# Patient Record
Sex: Female | Born: 2000 | Race: Black or African American | Hispanic: No | Marital: Single | Smoking: Former smoker
Health system: Southern US, Community
[De-identification: ages and names within clinical notes are randomized; demographics above are authoritative.]

## PROBLEM LIST (undated history)

## (undated) ENCOUNTER — Inpatient Hospital Stay (HOSPITAL_COMMUNITY): Payer: Self-pay

## (undated) DIAGNOSIS — Z789 Other specified health status: Secondary | ICD-10-CM

## (undated) DIAGNOSIS — N739 Female pelvic inflammatory disease, unspecified: Secondary | ICD-10-CM

## (undated) HISTORY — PX: NO PAST SURGERIES: SHX2092

---
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
## 2019-08-09 ENCOUNTER — Emergency Department (HOSPITAL_COMMUNITY): Payer: Medicaid Other

## 2019-08-09 ENCOUNTER — Encounter (HOSPITAL_COMMUNITY): Payer: Self-pay

## 2019-08-09 ENCOUNTER — Emergency Department (HOSPITAL_COMMUNITY)
Admission: EM | Admit: 2019-08-09 | Discharge: 2019-08-09 | Disposition: A | Discharge status: Home / Self Care | Payer: Medicaid Other | Attending: Emergency Medicine | Admitting: Emergency Medicine

## 2019-08-09 ENCOUNTER — Other Ambulatory Visit: Payer: Self-pay

## 2019-08-09 DIAGNOSIS — N939 Abnormal uterine and vaginal bleeding, unspecified: Secondary | ICD-10-CM | POA: Insufficient documentation

## 2019-08-09 DIAGNOSIS — Z79899 Other long term (current) drug therapy: Secondary | ICD-10-CM | POA: Diagnosis not present

## 2019-08-09 DIAGNOSIS — N739 Female pelvic inflammatory disease, unspecified: Secondary | ICD-10-CM | POA: Insufficient documentation

## 2019-08-09 DIAGNOSIS — N730 Acute parametritis and pelvic cellulitis: Secondary | ICD-10-CM

## 2019-08-09 DIAGNOSIS — Z87891 Personal history of nicotine dependence: Secondary | ICD-10-CM | POA: Diagnosis not present

## 2019-08-09 HISTORY — DX: Female pelvic inflammatory disease, unspecified: N73.9

## 2019-08-09 LAB — POC URINE PREG, ED: Preg Test, Ur: NEGATIVE

## 2019-08-09 MED ORDER — METRONIDAZOLE 500 MG PO TABS
2000.0000 mg | ORAL_TABLET | Freq: Once | ORAL | Status: AC
  Administered 2019-08-09: 2000 mg via ORAL
  Filled 2019-08-09: qty 4

## 2019-08-09 NOTE — Discharge Instructions (Addendum)
You will need to take the doxycycline that you are prescribed.  I would recommend following up with a GYN doctor as well.  Your ultrasound did not show any abnormality at this time.

## 2019-08-09 NOTE — ED Triage Notes (Signed)
Pt presents to ED with complaints of vaginal bleeding. Pt states she has had intermittent vaginal bleeding with depo injection and states she was recently treated for trich and pelvic inflammatory disease

## 2019-08-17 NOTE — ED Provider Notes (Signed)
[facility] EMERGENCY DEPARTMENT Provider Note   CSN: [id number] Arrival date & time: 08/09/19  1030     History Chief Complaint  Patient presents with  . Vaginal Bleeding    Stacy Richardson is a 19 y.o. female.  HPI Patient presents to the emergency department with vaginal bleeding that has been recently treated for trichomonas and PID.  The patient states she did not take the antibiotics for the PID.  She states that she was unsure of exactly what was being treated.  The patient states that nothing seems to make her condition better or worse.  Patient states that the bleeding is, dark brownish colored fluid.  The patient denies chest pain, shortness of breath, headache,blurred vision, neck pain, fever, cough, weakness, numbness, dizziness, anorexia, edema, , nausea, vomiting, diarrhea, rash, back pain, dysuria, hematemesis, bloody stool, near syncope, or syncope.  Patient is also having lower pelvic pain.    Past Medical History:  Diagnosis Date  . Medical history non-contributory   . Pelvic inflammatory disease     Patient Active Problem List   Diagnosis Date Noted  . Status post vacuum-assisted vaginal delivery 01/28/2018  . PROM (premature rupture of membranes) 01/27/2018    Past Surgical History:  Procedure Laterality Date  . NO PAST SURGERIES       OB History    Gravida  1   Para  1   Term  1   Preterm      AB      Living  1     SAB      TAB      Ectopic      Multiple  0   Live Births  1           No family history on file.  Social History   Tobacco Use  . Smoking status: Former Smoker    Quit date: 06/25/2017    Years since quitting: 2.1  . Smokeless tobacco: Never Used  Substance Use Topics  . Alcohol use: No  . Drug use: No    Types: Marijuana    Home Medications Prior to Admission medications   Medication Sig Start Date End Date Taking? Authorizing Provider  ibuprofen (ADVIL,MOTRIN) 600 MG tablet Take 1 tablet (600 mg total)  by mouth every 6 (six) hours. 01/29/18   Arvilla Market, DO  Prenatal Multivit-Min-Fe-FA (PRENATAL VITAMINS) 0.8 MG tablet Take 1 tablet by mouth daily. 06/26/17   Vicki Mallet, MD  senna-docusate (SENOKOT-S) 8.6-50 MG tablet Take 2 tablets by mouth daily. 01/30/18   Arvilla Market, DO    Allergies    Patient has no known allergies.  Review of Systems   Review of Systems All other systems negative except as documented in the HPI. All pertinent positives and negatives as reviewed in the HPI. Physical Exam Updated Vital Signs BP 109/64   Pulse 85   Temp 98.5 F (36.9 C) (Oral)   Resp 20   Ht 5\' 4"  (1.626 m)   Wt 53.5 kg   SpO2 100%   BMI 20.25 kg/m   Physical Exam Vitals and nursing note reviewed.  Constitutional:      General: She is not in acute distress.    Appearance: She is well-developed.  HENT:     Head: Normocephalic and atraumatic.  Eyes:     Pupils: Pupils are equal, round, and reactive to light.  Cardiovascular:     Rate and Rhythm: Normal rate and regular rhythm.  Heart sounds: Normal heart sounds. No murmur. No friction rub. No gallop.   Pulmonary:     Effort: Pulmonary effort is normal. No respiratory distress.     Breath sounds: Normal breath sounds. No wheezing.  Abdominal:     General: Bowel sounds are normal. There is no distension.     Palpations: Abdomen is soft.     Tenderness: There is no abdominal tenderness.  Genitourinary:    Comments: Patient declined pelvic exam at this time due to the fact she had a recent 1. Musculoskeletal:     Cervical back: Normal range of motion and neck supple.  Skin:    General: Skin is warm and dry.     Capillary Refill: Capillary refill takes less than 2 seconds.     Findings: No erythema or rash.  Neurological:     Mental Status: She is alert and oriented to person, place, and time.     Motor: No abnormal muscle tone.     Coordination: Coordination normal.  Psychiatric:         Behavior: Behavior normal.     ED Results / Procedures / Treatments   Labs (all labs ordered are listed, but only abnormal results are displayed) Labs Reviewed  POC URINE PREG, ED    EKG None  Radiology No results found.  Procedures Procedures (including critical care time)  Medications Ordered in ED Medications  metroNIDAZOLE (FLAGYL) tablet 2,000 mg (2,000 mg Oral Given 08/09/19 1426)    ED Course  I have reviewed the triage vital signs and the nursing notes.  Pertinent labs & imaging results that were available during my care of the patient were reviewed by me and considered in my medical decision making (see chart for details).    MDM Rules/Calculators/A&P                      Patient will be asked to take the antibiotics that Were prescribed.  She is given treatment here in the emergency department as well.  The ultrasound does not show any signs of TOA.  Patient is referred back to her GYN doctor in the [facility] clinic as needed. Final Clinical Impression(s) / ED Diagnoses Final diagnoses:  Abnormal vaginal bleeding  PID (acute pelvic inflammatory disease)    Rx / DC Orders ED Discharge Orders    None       Dalia Heading, PA-C 08/17/19 2312    Daleen Bo, MD 08/18/19 [phone]

## 2019-08-27 ENCOUNTER — Emergency Department (HOSPITAL_COMMUNITY)
Admission: EM | Admit: 2019-08-27 | Discharge: 2019-08-27 | Disposition: A | Discharge status: Home / Self Care | Payer: Medicaid Other | Attending: Emergency Medicine | Admitting: Emergency Medicine

## 2019-08-27 ENCOUNTER — Other Ambulatory Visit: Payer: Self-pay

## 2019-08-27 ENCOUNTER — Encounter (HOSPITAL_COMMUNITY): Payer: Self-pay | Admitting: *Deleted

## 2019-08-27 DIAGNOSIS — N939 Abnormal uterine and vaginal bleeding, unspecified: Secondary | ICD-10-CM | POA: Diagnosis present

## 2019-08-27 DIAGNOSIS — Z793 Long term (current) use of hormonal contraceptives: Secondary | ICD-10-CM | POA: Diagnosis not present

## 2019-08-27 DIAGNOSIS — Z87891 Personal history of nicotine dependence: Secondary | ICD-10-CM | POA: Insufficient documentation

## 2019-08-27 LAB — WET PREP, GENITAL
Clue Cells Wet Prep HPF POC: NONE SEEN
Sperm: NONE SEEN
Trich, Wet Prep: NONE SEEN
WBC, Wet Prep HPF POC: NONE SEEN
Yeast Wet Prep HPF POC: NONE SEEN

## 2019-08-27 LAB — POC URINE PREG, ED: Preg Test, Ur: NEGATIVE

## 2019-08-27 NOTE — ED Provider Notes (Signed)
[facility] EMERGENCY DEPARTMENT Provider Note   CSN: [id number] Arrival date & time: 08/27/19  1615     History Chief Complaint  Patient presents with  . Vaginal Bleeding    Stacy Richardson is a 19 y.o. female.  HPI   This patient is a 19 year old female who has been on the Depo shot for the last 18 months, she has not had any significant bleeding other than occasional spotting when she has an STD.  She reports that in the last couple of weeks she has had some abnormal bleeding for which she has went to the urgent care, was treated for pelvic inflammatory disease with "a shot and a pill that starts with D and a pill that starts with M".  She reports that she did not take the pills because she was told that the shot would cover all kinds of bacteria.  When the bleeding became more heavy and she started to have some clear discharge she decided to take the other medications though she did not take them regularly.  She is now having heavier bleeding over the last 24 hours with the occasional lower abdominal cramps.  She is due for her Depo shot in the next 48 hours but because of the bleeding today she had to leave work early to come to the hospital.  She denies nausea vomiting fevers or chills or back pain.  She is still sexually active, she denies any injuries or pain with sex.  Past Medical History:  Diagnosis Date  . Medical history non-contributory   . Pelvic inflammatory disease     Patient Active Problem List   Diagnosis Date Noted  . Status post vacuum-assisted vaginal delivery 01/28/2018  . PROM (premature rupture of membranes) 01/27/2018    Past Surgical History:  Procedure Laterality Date  . NO PAST SURGERIES       OB History    Gravida  1   Para  1   Term  1   Preterm      AB      Living  1     SAB      TAB      Ectopic      Multiple  0   Live Births  1           History reviewed. No pertinent family history.  Social History   Tobacco Use    . Smoking status: Former Smoker    Quit date: 06/25/2017    Years since quitting: 2.1  . Smokeless tobacco: Never Used  Substance Use Topics  . Alcohol use: No  . Drug use: Not Currently    Types: Marijuana    Home Medications Prior to Admission medications   Medication Sig Start Date End Date Taking? Authorizing Provider  medroxyPROGESTERone (DEPO-PROVERA) 150 MG/ML injection Inject 150 mg into the muscle every 3 (three) months.   Yes [provider]    Allergies    Patient has no known allergies.  Review of Systems   Review of Systems  All other systems reviewed and are negative.   Physical Exam Updated Vital Signs BP 119/84 (BP Location: Right Arm)   Pulse 62   Temp 97.6 F (36.4 C) (Oral)   Resp 20   Ht 1.626 m (5\' 4" )   Wt 53.1 kg   SpO2 100%   BMI 20.08 kg/m   Physical Exam Vitals and nursing note reviewed.  Constitutional:      General: She is not in acute  distress.    Appearance: She is well-developed.  HENT:     Head: Normocephalic and atraumatic.     Mouth/Throat:     Pharynx: No oropharyngeal exudate.  Eyes:     General: No scleral icterus.       Right eye: No discharge.        Left eye: No discharge.     Conjunctiva/sclera: Conjunctivae normal.     Pupils: Pupils are equal, round, and reactive to light.  Neck:     Thyroid: No thyromegaly.     Vascular: No JVD.  Cardiovascular:     Rate and Rhythm: Normal rate and regular rhythm.     Heart sounds: Normal heart sounds. No murmur. No friction rub. No gallop.   Pulmonary:     Effort: Pulmonary effort is normal. No respiratory distress.     Breath sounds: Normal breath sounds. No wheezing or rales.  Abdominal:     General: Bowel sounds are normal. There is no distension.     Palpations: Abdomen is soft. There is no mass.     Tenderness: There is no abdominal tenderness.  Genitourinary:    Comments: Chaperone present for exam: Normal-appearing external genitalia, normal-appearing  cervix, vaginal vault full of bright and dark red blood.  No foul odor, no foreign body, no tenderness Musculoskeletal:        General: No tenderness. Normal range of motion.     Cervical back: Normal range of motion and neck supple.  Lymphadenopathy:     Cervical: No cervical adenopathy.  Skin:    General: Skin is warm and dry.     Findings: No erythema or rash.  Neurological:     Mental Status: She is alert.     Coordination: Coordination normal.  Psychiatric:        Behavior: Behavior normal.     ED Results / Procedures / Treatments   Labs (all labs ordered are listed, but only abnormal results are displayed) Labs Reviewed  WET PREP, GENITAL  POC URINE PREG, ED  GC/CHLAMYDIA PROBE AMP ([place]) NOT AT [facility]    EKG None  Radiology No results found.  Procedures Procedures (including critical care time)  Medications Ordered in ED Medications - No data to display  ED Course  I have reviewed the triage vital signs and the nursing notes.  Pertinent labs & imaging results that were available during my care of the patient were reviewed by me and considered in my medical decision making (see chart for details).    MDM Rules/Calculators/A&P                      I discussed the care with the on-call gynecologist Dr. Despina Hidden who recommends that the patient can follow-up for her Depo shot on Wednesday, states that the bleeding should taper off after that.  Recommends against any oral medications at this time, this will be communicated to the patient.  She appears hemodynamically very stable, preg negative.    Vitals:   08/27/19 1621 08/27/19 1622  BP: 119/84   Pulse: 62   Resp: 20   Temp: 97.6 F (36.4 C)   TempSrc: Oral   SpO2: 100%   Weight:  53.1 kg  Height:  1.626 m (5\' 4" )     Final Clinical Impression(s) / ED Diagnoses Final diagnoses:  Vaginal bleeding    Rx / DC Orders ED Discharge Orders    None       ,  MD 08/27/19 1918

## 2019-08-27 NOTE — ED Triage Notes (Signed)
Pt with vaginal bleeding since Saturday, passed a large clot today and other clots.  Pt states she took some antibiotics "that were left over " from last time pt was here and dx with trichomonas.

## 2019-08-27 NOTE — Discharge Instructions (Signed)
Your testing has returned and so far it shows no signs of sexually transmitted diseases.  Your pregnancy test is negative  Your bleeding is likely because of the need for another Depo shot.  After you get the Depo shot your bleeding should slow down over the next week and then stop.  I talked to the gynecologist on-call and gave you their phone number above.  Please call the office for the next available appointment to establish care with a gynecologist.  As far as treatment of the bleeding, they recommended against using any medications at this time as the bleeding will stop with the Depo shot and the other medications can have side effects.

## 2019-08-28 LAB — GC/CHLAMYDIA PROBE AMP (~~LOC~~) NOT AT ARMC
Chlamydia: NEGATIVE
Neisseria Gonorrhea: NEGATIVE

## 2019-09-10 ENCOUNTER — Encounter (INDEPENDENT_AMBULATORY_CARE_PROVIDER_SITE_OTHER): Payer: Self-pay | Admitting: Nurse Practitioner

## 2019-09-10 ENCOUNTER — Other Ambulatory Visit: Payer: Self-pay

## 2019-09-10 ENCOUNTER — Ambulatory Visit (INDEPENDENT_AMBULATORY_CARE_PROVIDER_SITE_OTHER): Payer: Medicaid Other | Admitting: Nurse Practitioner

## 2019-09-10 VITALS — BP 90/50 | Temp 98.5°F | Ht 64.0 in | Wt 115.0 lb

## 2019-09-10 DIAGNOSIS — Z113 Encounter for screening for infections with a predominantly sexual mode of transmission: Secondary | ICD-10-CM | POA: Diagnosis not present

## 2019-09-10 DIAGNOSIS — Z131 Encounter for screening for diabetes mellitus: Secondary | ICD-10-CM | POA: Diagnosis not present

## 2019-09-10 DIAGNOSIS — Z139 Encounter for screening, unspecified: Secondary | ICD-10-CM

## 2019-09-10 DIAGNOSIS — N939 Abnormal uterine and vaginal bleeding, unspecified: Secondary | ICD-10-CM

## 2019-09-10 DIAGNOSIS — N730 Acute parametritis and pelvic cellulitis: Secondary | ICD-10-CM

## 2019-09-10 DIAGNOSIS — B3731 Acute candidiasis of vulva and vagina: Secondary | ICD-10-CM

## 2019-09-10 DIAGNOSIS — R6889 Other general symptoms and signs: Secondary | ICD-10-CM

## 2019-09-10 DIAGNOSIS — Z13220 Encounter for screening for lipoid disorders: Secondary | ICD-10-CM

## 2019-09-10 DIAGNOSIS — B373 Candidiasis of vulva and vagina: Secondary | ICD-10-CM

## 2019-09-10 MED ORDER — DOXYCYCLINE HYCLATE 100 MG PO TABS: 100.0000 mg | ORAL_TABLET | Freq: Two times a day (BID) | ORAL | 0 refills | Status: DC

## 2019-09-10 MED ORDER — FLUCONAZOLE 150 MG PO TABS: 150.0000 mg | ORAL_TABLET | Freq: Once | ORAL | 0 refills | Status: AC

## 2019-09-10 NOTE — Patient Instructions (Signed)
Thank you for choosing [facility] as your medical provider! If you have any questions or concerns regarding your health care, please do not hesitate to call our office.  I will notify you of all of your blood work results as they are released via your MyChart.  You should hear from the OB/GYN's office within 2 weeks to schedule your appointment with them.  If you do not hear from them, please call this office to let us know so we can address this for you.  Please complete your antibiotic as prescribed in its entirety.  Please follow-up as scheduled in 1 month. We look forward to seeing you again soon!   At [facility] we value your feedback. You may receive a survey about your visit today. Please share your experience as we strive to create trusting relationships with our patients to provide genuine, compassionate, quality care.  We appreciate your understanding and patience as we review any laboratory studies, imaging, and other diagnostic tests that are ordered as we care for you. We do our best to address any and all results in a timely manner. If you do not hear about test results within 1 week, please do not hesitate to contact us. If we referred you to a specialist during your visit or ordered imaging testing, contact the office if you have not been contacted to be scheduled within 1 weeks.  We also encourage the use of MyChart, which contains your medical information for your review as well. If you are not enrolled in this feature, an access code is on this after visit summary for your convenience. Thank you for allowing [place] to be involved in your care.

## 2019-09-10 NOTE — Progress Notes (Signed)
Subjective:  Patient ID: Stacy Richardson, female    DOB: [birth year]  Age: 19 y.o. MRN: [id number]  CC:  Chief Complaint  Patient presents with  . Other    Establish care, vaginal bleeding      HPI  This patient comes in today for the above.  She is new to this area and recently moved from [place].  She was getting her health care at the health department in [place].  She would like to establish care as a new patient here.  She has not had the flu shot this year, and would like to hold off on having this administered at this time.  She believes she got the tetanus shot during her last pregnancy which was approximately 2 years ago.  She does not have any history of chronic medical condition that would require Pneumovax vaccination.  Her main complaint today is that she has been having persistent vaginal bleeding.  She tells me in February she started having persistent vaginal bleeding.  At that time she ended up being evaluated for this in the emergency department, and she was found to be positive for trichomonas infection.  It was thought that she also may have pelvic inflammatory disease.  She was started on a course of metronidazole, and completed this course.  She was also told to take doxycycline, but she did not complete this medication course.  Her vaginal bleeding has been intermittent, but seems to be elicited by sexual intercourse.  Last time she was evaluated was in the emergency department approximately 2 weeks ago for this.  Pregnancy was negative at that time.  She was also tested for gonorrhea and chlamydia both of which were negative at that time.  Wet prep was completed and was negative for trichomonas, yeast, and BV.  She tells me she does not experience pain generally during sexual intercourse, and does not have abdominal or pelvic pain right now.  She tells me that she has been bleeding vaginally for the past 4 days, but the bleeding has started to lighten up.  She  denies any fatigue, shortness of breath, dizziness, or near syncopal episodes.  She does use Depo-Provera IM shots for contraception.  She has been on this medication for multiple years prior to her pregnancy, and has been on it again for the last 18 months.  She has tolerated this well in the past without excessive vaginal bleeding.  Last administration was approximately 1 week ago.   Past Medical History:  Diagnosis Date  . Medical history non-contributory   . Pelvic inflammatory disease       No family history on file.  Social History   Social History Narrative  . Not on file   Social History   Tobacco Use  . Smoking status: Former Smoker    Quit date: 06/25/2017    Years since quitting: 2.2  . Smokeless tobacco: Never Used  Substance Use Topics  . Alcohol use: No     No outpatient medications have been marked as taking for the 09/10/19 encounter (Office Visit) with Ailene Ards, NP.    ROS:  Review of Systems  Constitutional: Negative.   Eyes: Negative.   Respiratory: Negative.   Cardiovascular: Negative.   Gastrointestinal: Negative.   Genitourinary:       (+) persistent vaginal bleeding  Musculoskeletal: Negative.   Neurological: Negative.   Psychiatric/Behavioral: Negative.      Objective:   Today's Vitals: BP (!) 90/50 (BP Location: Left  Arm, Patient Position: Sitting, Cuff Size: Normal)   Temp 98.5 F (36.9 C) (Oral)   Ht _0  (1.626 m)   Wt 115 lb (52.2 kg)   BMI 19.74 kg/m  Vitals with BMI 09/10/2019 08/27/2019 08/27/2019  Height _1  - _2   Weight 115 lbs - 117 lbs  BMI 58.85 - 02.77  Systolic 90 412 -  Diastolic 50 76 -  Pulse - 70 -     Physical Exam Vitals reviewed.  Constitutional:      General: She is not in acute distress.    Appearance: Normal appearance.  HENT:     Head: Normocephalic and atraumatic.  Neck:     Vascular: No carotid bruit.  Cardiovascular:     Rate and Rhythm: Normal rate and regular rhythm.     Pulses:  Normal pulses.     Heart sounds: Normal heart sounds.  Pulmonary:     Effort: Pulmonary effort is normal.     Breath sounds: Normal breath sounds.  Skin:    General: Skin is warm and dry.  Neurological:     General: No focal deficit present.     Mental Status: She is alert and oriented to person, place, and time.  Psychiatric:        Mood and Affect: Mood normal.        Behavior: Behavior normal.        Judgment: Judgment normal.          Assessment and Plan   1. PID (acute pelvic inflammatory disease)   2. Screening for condition   3. Routine screening for STI (sexually transmitted infection)   4. Screening for diabetes mellitus   5. Screening for hyperlipidemia   6. Cold intolerance   7. Vaginal bleeding   8. Vaginal yeast infection      Plan: 1.,  7.,  8.  For now I will restart her course of doxycycline for probable PID.  She did mention that she is prone to yeast infections, thus I will give her prophylactic Diflucan.  I am also can refer her to OB/GYN for further evaluation and management of her persistent vaginal bleeding, and discussion of possibly needing to change to a different contraception.  I will also check blood work today including CBC to monitor for anemia.  I do see that her blood pressure was fairly low on exam today, however she is asymptomatic at this time.  2.,  3.,  4.,  5.,  6.  I will collect blood work for further evaluation.  She declined to have flu shot administered today, however I have encouraged her to let us know if she changes her mind.  She tells me she understands.   Tests ordered Orders Placed This Encounter  Procedures  . CBC  . CMP with eGFR([facility])  . Lipid Panel  . Hemoglobin A1c  . TSH  . T3, Free  . T4, Free  . HIV antibody (with reflex)  . RPR  . Ferritin  . Iron and TIBC  . Vitamin D, 25-hydroxy  . Ambulatory referral to Obstetrics / Gynecology      Meds ordered this encounter  Medications  . doxycycline  (VIBRA-TABS) 100 MG tablet    Sig: Take 1 tablet (100 mg total) by mouth 2 (two) times daily.    Dispense:  28 tablet    Refill:  0    Order Specific Question:   Supervising Provider    Answer:   Doree Albee [  [id number]]  . fluconazole (DIFLUCAN) 150 MG tablet    Sig: Take 1 tablet (150 mg total) by mouth once for 1 dose. You may repeat dose by mouth once if symptoms persist after 3 days    Dispense:  2 tablet    Refill:  0    Order Specific Question:   Supervising Provider    Answer:   Doree Albee [[id number]]    Patient to follow-up in 1 month or sooner as needed.  Ailene Ards, NP

## 2019-09-11 LAB — LIPID PANEL
Cholesterol: 109 mg/dL (ref <170)
HDL: 53 mg/dL (ref >45)
LDL Cholesterol (Calc): 39 mg/dL (calc) (ref <110)
Non-HDL Cholesterol (Calc): 56 mg/dL (calc) (ref <120)
Total CHOL/HDL Ratio: 2.1 (calc) (ref <5.0)
Triglycerides: 91 mg/dL — ABNORMAL HIGH (ref <90)

## 2019-09-11 LAB — COMPLETE METABOLIC PANEL WITH GFR
AG Ratio: 1.9 (calc) (ref 1.0–2.5)
ALT: 18 U/L (ref 5–32)
AST: 13 U/L (ref 12–32)
Albumin: 4.2 g/dL (ref 3.6–5.1)
Alkaline phosphatase (APISO): 50 U/L (ref 36–128)
BUN: 9 mg/dL (ref 7–20)
CO2: 26 mmol/L (ref 20–32)
Calcium: 9.3 mg/dL (ref 8.9–10.4)
Chloride: 109 mmol/L (ref 98–110)
Creat: 0.68 mg/dL (ref 0.50–1.00)
GFR, Est African American: 147 mL/min/{1.73_m2} (ref >=60)
GFR, Est Non African American: 127 mL/min/{1.73_m2} (ref >=60)
Globulin: 2.2 g/dL (calc) (ref 2.0–3.8)
Glucose, Bld: 82 mg/dL (ref 65–99)
Potassium: 4.2 mmol/L (ref 3.8–5.1)
Sodium: 140 mmol/L (ref 135–146)
Total Bilirubin: 0.3 mg/dL (ref 0.2–1.1)
Total Protein: 6.4 g/dL (ref 6.3–8.2)

## 2019-09-11 LAB — IRON, TOTAL/TOTAL IRON BINDING CAP
%SAT: 28 % (calc) (ref 15–45)
Iron: 90 ug/dL (ref 27–164)
TIBC: 327 mcg/dL (calc) (ref 271–448)

## 2019-09-11 LAB — HIV ANTIBODY (ROUTINE TESTING W REFLEX): HIV 1&2 Ab, 4th Generation: NONREACTIVE

## 2019-09-11 LAB — CBC
HCT: 38.7 % (ref 35.0–45.0)
Hemoglobin: 12.4 g/dL (ref 11.7–15.5)
MCH: 28.1 pg (ref 27.0–33.0)
MCHC: 32 g/dL (ref 32.0–36.0)
MCV: 87.8 fL (ref 80.0–100.0)
MPV: 9.4 fL (ref 7.5–12.5)
Platelets: 306 10*3/uL (ref 140–400)
RBC: 4.41 10*6/uL (ref 3.80–5.10)
RDW: 11.5 % (ref 11.0–15.0)
WBC: 6.7 10*3/uL (ref 3.8–10.8)

## 2019-09-11 LAB — FERRITIN: Ferritin: 8 ng/mL — ABNORMAL LOW (ref 16–154)

## 2019-09-11 LAB — VITAMIN D 25 HYDROXY (VIT D DEFICIENCY, FRACTURES): Vit D, 25-Hydroxy: 19 ng/mL — ABNORMAL LOW (ref 30–100)

## 2019-09-11 LAB — T4, FREE: Free T4: 1.1 ng/dL (ref 0.8–1.4)

## 2019-09-11 LAB — HEMOGLOBIN A1C
Hgb A1c MFr Bld: 5.1 % of total Hgb (ref <5.7)
Mean Plasma Glucose: 100 (calc)
eAG (mmol/L): 5.5 (calc)

## 2019-09-11 LAB — T3, FREE: T3, Free: 3 pg/mL (ref 3.0–4.7)

## 2019-09-11 LAB — TSH: TSH: 1.13 mIU/L

## 2019-09-11 LAB — RPR: RPR Ser Ql: NONREACTIVE

## 2019-09-17 ENCOUNTER — Telehealth: Payer: Self-pay | Admitting: *Deleted

## 2019-09-17 NOTE — Telephone Encounter (Signed)
I called pt and she said she didn't have any questions now. Has appt on 09/21/19. Plans on keeping that appt. JSY

## 2019-09-17 NOTE — Telephone Encounter (Signed)
Has an appt Friday but wants a nurse to call. Pt didn't leave any more info. Please call. Thanks!!

## 2019-09-21 ENCOUNTER — Other Ambulatory Visit: Payer: Self-pay

## 2019-09-21 ENCOUNTER — Ambulatory Visit (INDEPENDENT_AMBULATORY_CARE_PROVIDER_SITE_OTHER): Payer: Medicaid Other | Admitting: Adult Health

## 2019-09-21 ENCOUNTER — Encounter: Payer: Self-pay | Admitting: Adult Health

## 2019-09-21 VITALS — BP 110/59 | HR 114 | Ht 64.0 in | Wt 111.0 lb

## 2019-09-21 DIAGNOSIS — N939 Abnormal uterine and vaginal bleeding, unspecified: Secondary | ICD-10-CM | POA: Diagnosis not present

## 2019-09-21 DIAGNOSIS — Z8742 Personal history of other diseases of the female genital tract: Secondary | ICD-10-CM | POA: Insufficient documentation

## 2019-09-21 MED ORDER — MEGESTROL ACETATE 40 MG PO TABS: ORAL_TABLET | ORAL | 1 refills | Status: DC

## 2019-09-21 NOTE — Progress Notes (Signed)
Patient ID: Stacy Richardson, female   DOB: 09-06-2000, 19 y.o.   MRN: [id number] History of Present Illness: Stacy Richardson is a 19 year old black female,single, G1P1 in complaining of vaginal bleeding on depo.Started in February and was treated for PID and trich in February and bleeding was heavier and had clots and seen in ER 08/27/19, and then got depo at health dept in [place] on 3/3 and bleeding stopped, had sex 3/6 and bleeding started back. On doxycyline from PCP  PCP is Jiles Prows NP     Current Medications, Allergies, Past Medical History, Past Surgical History, Family History and Social History were reviewed in [name] record.     Review of Systems: Vaginal bleeding since February Treated for PID and trich in February Seen in ER 08/27/19 for vaginal bleeding with clots Got depo 08/29/19 and bleeding stopped and had sex 3/6 and bleeding started back, was seen at PCP and given doxycycline is still taking. Denies any headaches or dizziness     Physical Exam:BP (!) 110/59 (BP Location: Left Arm, Patient Position: Sitting, Cuff Size: Normal)   Pulse (!) 114   Ht 5\' 4"  (1.626 m)   Wt 111 lb (50.3 kg)   Breastfeeding No   BMI 19.05 kg/m  General:  Well developed, well nourished, no acute distress Skin:  Warm and dry Neck:  Midline trachea, normal thyroid, good ROM, no lymphadenopathy Lungs; Clear to auscultation bilaterally Cardiovascular: Regular rate and rhythm Pelvic:  External genitalia is normal in appearance, no lesions.  The vagina is normal in appearance,scant pink discharge with out odor. Urethra has no lesions or masses. The cervix is bulbous.No CMT.  Uterus is felt to be normal size, shape, and contour.  No adnexal masses or tenderness noted.Bladder is non tender, no masses felt.nuswab obtained. Extremities/musculoskeletal:  No swelling or varicosities noted, no clubbing or cyanosis Psych:  No mood changes, alert and cooperative,seems happy Alcohol audit  is 0. Examination chaperoned by RN.  Impression and Plan: 1. Abnormal uterine bleeding (AUB) Nuswab sent  Will Rx megace Meds ordered this encounter  Medications  . megestrol (MEGACE) 40 MG tablet    Sig: Take 3 x 5 days then 2 x 5 days then 1 daily    Dispense:  45 tablet    Refill:  1    Order Specific Question:   Supervising Provider    Answer:   Jobe Marker H [[id number]]  Return in 2 weeks for ROS  2. History of PID Finish doxy

## 2019-09-24 LAB — NUSWAB VAGINITIS PLUS (VG+)
Candida albicans, NAA: NEGATIVE
Candida glabrata, NAA: NEGATIVE
Chlamydia trachomatis, NAA: NEGATIVE
Neisseria gonorrhoeae, NAA: NEGATIVE
Trich vag by NAA: NEGATIVE

## 2019-10-05 ENCOUNTER — Other Ambulatory Visit: Payer: Self-pay

## 2019-10-05 ENCOUNTER — Encounter: Payer: Self-pay | Admitting: Adult Health

## 2019-10-05 ENCOUNTER — Ambulatory Visit (INDEPENDENT_AMBULATORY_CARE_PROVIDER_SITE_OTHER): Payer: Medicaid Other | Admitting: Adult Health

## 2019-10-05 VITALS — BP 103/67 | HR 100 | Ht 64.0 in | Wt 111.0 lb

## 2019-10-05 DIAGNOSIS — N921 Excessive and frequent menstruation with irregular cycle: Secondary | ICD-10-CM | POA: Diagnosis not present

## 2019-10-05 DIAGNOSIS — Z8742 Personal history of other diseases of the female genital tract: Secondary | ICD-10-CM | POA: Diagnosis not present

## 2019-10-05 NOTE — Progress Notes (Signed)
  Subjective:     Patient ID: Stacy Richardson, female   DOB: 02-24-2001, 19 y.o.   MRN: [id number]  HPI Stacy Richardson is a 19 year old black female,single, G1P1 back in follow up of AUB with Depo, was given megace but bleeding stopped after visit.  PCP is Jiles Prows, NP  Review of Systems Bleeding stopped after last visit, did not take depo Has no pain, had sex last weekend and no pain  Reviewed past medical,surgical, social and family history. Reviewed medications and allergies.     Objective:   Physical Exam BP 103/67 (BP Location: Left Arm, Patient Position: Sitting, Cuff Size: Normal)   Pulse 100   Ht 5\' 4"  (1.626 m)   Wt 111 lb (50.3 kg)   BMI 19.05 kg/m  Skin warm and dry. Lungs: clear to ausculation bilaterally. Cardiovascular: regular rate and rhythm.    Assessment:     1. Breakthrough bleeding on depo provera Has resolved,did not have to take megace  2. History of PID    Plan:     Follow up prn

## 2019-10-11 ENCOUNTER — Ambulatory Visit (INDEPENDENT_AMBULATORY_CARE_PROVIDER_SITE_OTHER): Payer: Medicaid Other | Admitting: Nurse Practitioner

## 2019-12-10 ENCOUNTER — Ambulatory Visit (INDEPENDENT_AMBULATORY_CARE_PROVIDER_SITE_OTHER): Payer: Medicaid Other | Admitting: Advanced Practice Midwife

## 2019-12-10 ENCOUNTER — Encounter: Payer: Self-pay | Admitting: Advanced Practice Midwife

## 2019-12-10 VITALS — BP 107/62 | HR 89 | Ht 64.0 in | Wt 125.0 lb

## 2019-12-10 DIAGNOSIS — N898 Other specified noninflammatory disorders of vagina: Secondary | ICD-10-CM | POA: Diagnosis not present

## 2019-12-10 MED ORDER — FLUCONAZOLE 150 MG PO TABS: ORAL_TABLET | ORAL | 2 refills | Status: DC

## 2019-12-10 NOTE — Progress Notes (Signed)
[facility] Visit  Patient name: Stacy Richardson MRN [id number]  Date of birth: [birth year]  CC & HPI:  Stacy Richardson is a 19 y.o.  female presenting today for clumpy vaginal discharge about 1.5 weeks ago, now yellow. Itches inside and out.  Hasn't shaved recently, so wondering if that could be making her itch.  Pertinent History Reviewed:  Medical & Surgical Hx:   Past Medical History:  Diagnosis Date  . Medical history non-contributory   . Pelvic inflammatory disease    Past Surgical History:  Procedure Laterality Date  . NO PAST SURGERIES     History reviewed. No pertinent family history.  Current Outpatient Medications:  .  medroxyPROGESTERone (DEPO-PROVERA) 150 MG/ML injection, Inject 150 mg into the muscle every 3 (three) months., Disp: , Rfl:  .  megestrol (MEGACE) 40 MG tablet, Take 3 x 5 days then 2 x 5 days then 1 daily (Patient not taking: Reported on 10/05/2019), Disp: 45 tablet, Rfl: 1 Social History: Reviewed -  reports that she quit smoking about 2 years ago. Her smoking use included cigarettes. She has never used smokeless tobacco.  Review of Systems:   Constitutional: Negative for fever and chills Eyes: Negative for visual disturbances Respiratory: Negative for shortness of breath, dyspnea Cardiovascular: Negative for chest pain or palpitations  Gastrointestinal: Negative for vomiting, diarrhea and constipation; no abdominal pain Genitourinary: Negative for dysuria and urgency, vaginal irritation or itching Musculoskeletal: Negative for back pain, joint pain, myalgias  Neurological: Negative for dizziness and headaches    Objective Findings:    Physical Examination: Vitals:   12/10/19 1524  BP: 107/62  Pulse: 89   General appearance - well appearing, and in no distress Mental status - alert, oriented to person, place, and time Chest:  Normal respiratory effort Heart - normal rate and regular rhythm Abdomen:  Soft, nontender Pelvic: no rash;  sse:  Normal appearing vaginal discharge.  Vagina non erythemas, no odor.  Wet prep small yeast. Musculoskeletal:  Normal range of motion without pain Extremities:  No edema    No results found for this or any previous visit (from the past 24 hour(s)).    Assessment & Plan:  A:   Possible yeast infection vs itching from not shaving  P:   Orders Placed This Encounter  Procedures  . GC/Chlamydia Probe Amp  . Trichomonas vaginalis, RNA  diflucan 150mg  po   No follow-ups on file.  CNM 12/10/2019 3:36 PM

## 2019-12-12 ENCOUNTER — Telehealth: Payer: Self-pay | Admitting: Advanced Practice Midwife

## 2019-12-12 NOTE — Telephone Encounter (Signed)
Patient called stating that she came into the office on 12/10/2019 because she had a yeast infection. Pt states that she would like to know the result of the lab work. Please contact pt

## 2019-12-13 LAB — SPECIMEN STATUS REPORT

## 2019-12-13 LAB — GC/CHLAMYDIA PROBE AMP
Chlamydia trachomatis, NAA: NEGATIVE
Neisseria Gonorrhoeae by PCR: NEGATIVE

## 2019-12-13 LAB — TRICHOMONAS VAGINALIS, PROBE AMP: Trich vag by NAA: NEGATIVE

## 2020-01-22 ENCOUNTER — Encounter (HOSPITAL_COMMUNITY): Payer: Self-pay | Admitting: Emergency Medicine

## 2020-01-22 ENCOUNTER — Other Ambulatory Visit: Payer: Self-pay

## 2020-01-22 ENCOUNTER — Emergency Department (HOSPITAL_COMMUNITY)
Admission: EM | Admit: 2020-01-22 | Discharge: 2020-01-23 | Disposition: A | Discharge status: Home / Self Care | Payer: Medicaid Other | Attending: Emergency Medicine | Admitting: Emergency Medicine

## 2020-01-22 ENCOUNTER — Emergency Department (HOSPITAL_COMMUNITY): Payer: Medicaid Other

## 2020-01-22 DIAGNOSIS — R0789 Other chest pain: Secondary | ICD-10-CM | POA: Diagnosis present

## 2020-01-22 DIAGNOSIS — R1319 Other dysphagia: Secondary | ICD-10-CM

## 2020-01-22 DIAGNOSIS — F17210 Nicotine dependence, cigarettes, uncomplicated: Secondary | ICD-10-CM | POA: Diagnosis not present

## 2020-01-22 DIAGNOSIS — R1310 Dysphagia, unspecified: Secondary | ICD-10-CM | POA: Insufficient documentation

## 2020-01-22 LAB — BASIC METABOLIC PANEL
Anion gap: 6 (ref 5–15)
BUN: 16 mg/dL (ref 6–20)
CO2: 23 mmol/L (ref 22–32)
Calcium: 8.4 mg/dL — ABNORMAL LOW (ref 8.9–10.3)
Chloride: 110 mmol/L (ref 98–111)
Creatinine, Ser: 0.78 mg/dL (ref 0.44–1.00)
GFR calc Af Amer: >60 mL/min (ref >60)
GFR calc non Af Amer: >60 mL/min (ref >60)
Glucose, Bld: 111 mg/dL — ABNORMAL HIGH (ref 70–99)
Potassium: 3.8 mmol/L (ref 3.5–5.1)
Sodium: 139 mmol/L (ref 135–145)

## 2020-01-22 LAB — CBC
HCT: 38.5 % (ref 36.0–46.0)
Hemoglobin: 12.4 g/dL (ref 12.0–15.0)
MCH: 28.4 pg (ref 26.0–34.0)
MCHC: 32.2 g/dL (ref 30.0–36.0)
MCV: 88.1 fL (ref 80.0–100.0)
Platelets: 261 10*3/uL (ref 150–400)
RBC: 4.37 MIL/uL (ref 3.87–5.11)
RDW: 12.5 % (ref 11.5–15.5)
WBC: 9.9 10*3/uL (ref 4.0–10.5)
nRBC: 0 % (ref 0.0–0.2)

## 2020-01-22 LAB — TROPONIN I (HIGH SENSITIVITY): Troponin I (High Sensitivity): <2 ng/L (ref <18)

## 2020-01-22 MED ORDER — SODIUM CHLORIDE 0.9% FLUSH: 3.0000 mL | Freq: Once | INTRAVENOUS | Status: DC

## 2020-01-22 NOTE — ED Triage Notes (Signed)
Pt c/o left sided chest pain intermittently for a while. Pt states tonight it started hurting while vaping.

## 2020-01-23 ENCOUNTER — Other Ambulatory Visit: Payer: Self-pay

## 2020-01-23 LAB — TROPONIN I (HIGH SENSITIVITY): Troponin I (High Sensitivity): <2 ng/L (ref <18)

## 2020-01-23 LAB — POC URINE PREG, ED: Preg Test, Ur: NEGATIVE

## 2020-01-23 MED ORDER — OMEPRAZOLE 20 MG PO CPDR
DELAYED_RELEASE_CAPSULE | ORAL | 0 refills | Status: DC
Start: 2020-01-23 — End: 2020-06-11

## 2020-01-23 NOTE — Discharge Instructions (Addendum)
Take the medication as prescribed.  Please call Dr. Wilburt Finlay office, the gastroenterologist on-call, to evaluate your esophageal pain when swallowing liquids.

## 2020-01-23 NOTE — ED Provider Notes (Signed)
[facility] EMERGENCY DEPARTMENT Provider Note   CSN: [id number] Arrival date & time: 01/22/20  2032   Time seen 1:20 AM  History Chief Complaint  Patient presents with  . Chest Pain    Stacy Richardson is a 19 y.o. female.  HPI  Patient told me about 6 PM tonight she was walking with her son in a stroller and she started getting a left anterior chest pain that she describes as pressure and tightness.  She denies shortness of breath, nausea, vomiting, or radiation of the pain.  She has not had coughing or fever.  She states she has had something similar when she vapes too much.  She states she is trying to slow down and she now only vapes 1 to 2 hours a day.  She denies any history of hypertension or diabetes.  She denies any family history of coronary artery disease.  She denies taking any birth control pills or hormone medications.  She also tells me that for the past 2 weeks whenever she drinks any liquid regardless of the temperature she can feel it going down from her throat behind her chest into her stomach.  She has not had any difficulty with foods.  She denies any acid reflux.  PCP Elenore Paddy, NP   Past Medical History:  Diagnosis Date  . Medical history non-contributory   . Pelvic inflammatory disease     Patient Active Problem List   Diagnosis Date Noted  . Breakthrough bleeding on depo provera 10/05/2019  . Abnormal uterine bleeding (AUB) 09/21/2019  . History of PID 09/21/2019  . Vaginal bleeding 09/10/2019  . Status post vacuum-assisted vaginal delivery 01/28/2018  . PROM (premature rupture of membranes) 01/27/2018    Past Surgical History:  Procedure Laterality Date  . NO PAST SURGERIES       OB History    Gravida  1   Para  1   Term  1   Preterm      AB      Living  1     SAB      TAB      Ectopic      Multiple  0   Live Births  1           No family history on file.  Social History   Tobacco Use  . Smoking status: Former  Smoker    Types: Cigarettes    Quit date: 06/25/2017    Years since quitting: 2.5  . Smokeless tobacco: Never Used  Vaping Use  . Vaping Use: Every day  . Substances: Nicotine  Substance Use Topics  . Alcohol use: No  . Drug use: Not Currently    Types: Marijuana  employed  Home Medications Prior to Admission medications   Medication Sig Start Date End Date Taking? Authorizing Provider  fluconazole (DIFLUCAN) 150 MG tablet 1 po stat; repeat in 3 days 12/10/19   Cresenzo-Dishmon, Scarlette Calico, CNM  medroxyPROGESTERone (DEPO-PROVERA) 150 MG/ML injection Inject 150 mg into the muscle every 3 (three) months.    [provider]  megestrol (MEGACE) 40 MG tablet Take 3 x 5 days then 2 x 5 days then 1 daily Patient not taking: Reported on 10/05/2019 09/21/19   Adline Potter, NP  omeprazole (PRILOSEC) 20 MG capsule Take 1 po BID x 2 weeks then once a day 01/23/20   Devoria Albe, MD    Allergies    Patient has no known allergies.  Review of Systems  Review of Systems  All other systems reviewed and are negative.   Physical Exam Updated Vital Signs BP (!) 104/61   Pulse 69   Temp 98.4 F (36.9 C) (Oral)   Resp 16   Ht 5\' 4"  (1.626 m)   Wt 55.3 kg   SpO2 100%   BMI 20.94 kg/m   Physical Exam Vitals and nursing note reviewed.  Constitutional:      General: She is not in acute distress.    Appearance: Normal appearance. She is normal weight. She is not toxic-appearing.  HENT:     Head: Normocephalic and atraumatic.     Right Ear: External ear normal.     Left Ear: External ear normal.     Nose: Nose normal.     Mouth/Throat:     Mouth: Mucous membranes are moist.     Pharynx: No oropharyngeal exudate or posterior oropharyngeal erythema.  Eyes:     Extraocular Movements: Extraocular movements intact.     Conjunctiva/sclera: Conjunctivae normal.     Pupils: Pupils are equal, round, and reactive to light.  Cardiovascular:     Rate and Rhythm: Normal rate and regular  rhythm.     Pulses: Normal pulses.     Heart sounds: Normal heart sounds.  Pulmonary:     Effort: Pulmonary effort is normal. No respiratory distress.     Breath sounds: Normal breath sounds. No stridor. No wheezing or rhonchi.  Chest:     Chest wall: Tenderness present.       Comments: Patient has mild chest wall tenderness in the same area where she tells me she has her chest pain. Musculoskeletal:        General: No deformity. Normal range of motion.     Cervical back: Normal range of motion.  Skin:    General: Skin is warm and dry.     Findings: No rash.  Neurological:     General: No focal deficit present.     Mental Status: She is alert and oriented to person, place, and time.     Cranial Nerves: No cranial nerve deficit.  Psychiatric:        Mood and Affect: Mood normal.        Behavior: Behavior normal.        Thought Content: Thought content normal.     ED Results / Procedures / Treatments   Labs (all labs ordered are listed, but only abnormal results are displayed) Results for orders placed or performed during the hospital encounter of 01/22/20  Basic metabolic panel  Result Value Ref Range   Sodium 139 135 - 145 mmol/L   Potassium 3.8 3.5 - 5.1 mmol/L   Chloride 110 98 - 111 mmol/L   CO2 23 22 - 32 mmol/L   Glucose, Bld 111 (H) 70 - 99 mg/dL   BUN 16 6 - 20 mg/dL   Creatinine, Ser 01/24/20 0.44 - 1.00 mg/dL   Calcium 8.4 (L) 8.9 - 10.3 mg/dL   GFR calc non Af Amer >60 >60 mL/min   GFR calc Af Amer >60 >60 mL/min   Anion gap 6 5 - 15  CBC  Result Value Ref Range   WBC 9.9 4.0 - 10.5 K/uL   RBC 4.37 3.87 - 5.11 MIL/uL   Hemoglobin 12.4 12.0 - 15.0 g/dL   HCT 8.46 36 - 46 %   MCV 88.1 80.0 - 100.0 fL   MCH 28.4 26.0 - 34.0 pg   MCHC 32.2 30.0 -  36.0 g/dL   RDW 99.3 71.6 - 96.7 %   Platelets 261 150 - 400 K/uL   nRBC 0.0 0.0 - 0.2 %  POC urine preg, ED  Result Value Ref Range   Preg Test, Ur NEGATIVE NEGATIVE  Troponin I (High Sensitivity)  Result Value  Ref Range   Troponin I (High Sensitivity) <2 <18 ng/L  Troponin I (High Sensitivity)  Result Value Ref Range   Troponin I (High Sensitivity) <2 <18 ng/L   Laboratory interpretation all normal except nonfasting hyperglycemia, normal delta troponin    EKG  #2 EKG Interpretation  Date/Time:  Wednesday January 23 2020 00:37:59 EDT Ventricular Rate:  60 PR Interval:    QRS Duration: 75 QT Interval:  400 QTC Calculation: 400 R Axis:   89 Text Interpretation: Sinus rhythm Borderline Q waves in lateral leads ST elev, probable normal early repol pattern Since last tracing rate slower 12 Aug 2017 Confirmed by Devoria Albe ([id number]) on 01/23/2020 1:49:32 AM   # 1 EKG      Radiology DG Chest 2 View  Result Date: 01/22/2020 CLINICAL DATA:  Chest pain EXAM: CHEST - 2 VIEW COMPARISON:  None. FINDINGS: The heart size and mediastinal contours are within normal limits. Both lungs are clear. The visualized skeletal structures are unremarkable. IMPRESSION: No active cardiopulmonary disease. Electronically Signed   By: Jonna Clark M.D.   On: 01/22/2020 21:40    Procedures Procedures (including critical care time)  Medications Ordered in ED Medications  sodium chloride flush (NS) 0.9 % injection 3 mL (3 mLs Intravenous Not Given 01/23/20 0005)    ED Course  I have reviewed the triage vital signs and the nursing notes.  Pertinent labs & imaging results that were available during my care of the patient were reviewed by me and considered in my medical decision making (see chart for details).    MDM Rules/Calculators/A&P                           Patient presents with some mild chest wall tenderness.  She does vape a lot.  She also describes 2 weeks of being able to feel herself swallowing liquids weather hot or cold but has absolutely no sensation or difficulty eating solids.  She was started on Prilosec.  I am going to refer her to gastroenterology.  At this point I do not feel she is having any  cardiac related chest pain.  She may be having some mild chest wall pain however with her esophageal problem right now I did not want to start her on some nonsteroidal anti-inflammatory drugs.  She was discharged home.   Patient requesting work note.  She also wants a guarantee that she will not have to come back.  I explained to her I cannot guarantee that any patient would not have to come back to the ED.     Final Clinical Impression(s) / ED Diagnoses Final diagnoses:  Atypical chest pain  Esophageal dysphagia    Rx / DC Orders ED Discharge Orders         Ordered    omeprazole (PRILOSEC) 20 MG capsule     Discontinue  Reprint     01/23/20 0142         Plan discharge  Devoria Albe, MD, Concha Pyo, MD 01/23/20 [phone]

## 2020-02-21 ENCOUNTER — Ambulatory Visit (INDEPENDENT_AMBULATORY_CARE_PROVIDER_SITE_OTHER): Payer: Medicaid Other | Admitting: Gastroenterology

## 2020-04-08 ENCOUNTER — Other Ambulatory Visit: Payer: Self-pay

## 2020-04-08 ENCOUNTER — Ambulatory Visit
Admission: EM | Admit: 2020-04-08 | Discharge: 2020-04-08 | Disposition: A | Discharge status: Home / Self Care | Payer: Medicaid Other | Attending: Emergency Medicine | Admitting: Emergency Medicine

## 2020-04-08 DIAGNOSIS — Z20822 Contact with and (suspected) exposure to covid-19: Secondary | ICD-10-CM | POA: Diagnosis not present

## 2020-04-08 DIAGNOSIS — J069 Acute upper respiratory infection, unspecified: Secondary | ICD-10-CM

## 2020-04-08 MED ORDER — BENZONATATE 100 MG PO CAPS
100.0000 mg | ORAL_CAPSULE | Freq: Three times a day (TID) | ORAL | 0 refills | Status: DC
Start: 2020-04-08 — End: 2020-06-11

## 2020-04-08 NOTE — ED Provider Notes (Signed)
[facility] CARE CENTER   [id number] 04/08/20 Arrival Time: 0917   CC: COVID symptoms  SUBJECTIVE: History from: patient.  Stacy Richardson is a 19 y.o. female who presents with congestion, cough, and sore throat x 2 days.  Denies sick exposure to COVID, flu or strep.  Denies alleviating factors.  Symptoms made worse with smoking.  Denies previous COVID infection.   Denies fever, chills, fatigue, SOB, wheezing, chest pain, nausea, changes in bowel or bladder habits.    Home covid test negative.   ROS: As per HPI.  All other pertinent ROS negative.     Past Medical History:  Diagnosis Date  . Medical history non-contributory   . Pelvic inflammatory disease    Past Surgical History:  Procedure Laterality Date  . NO PAST SURGERIES     No Known Allergies No current facility-administered medications on file prior to encounter.   Current Outpatient Medications on File Prior to Encounter  Medication Sig Dispense Refill  . fluconazole (DIFLUCAN) 150 MG tablet 1 po stat; repeat in 3 days 2 tablet 2  . medroxyPROGESTERone (DEPO-PROVERA) 150 MG/ML injection Inject 150 mg into the muscle every 3 (three) months.    . megestrol (MEGACE) 40 MG tablet Take 3 x 5 days then 2 x 5 days then 1 daily (Patient not taking: Reported on 10/05/2019) 45 tablet 1  . omeprazole (PRILOSEC) 20 MG capsule Take 1 po BID x 2 weeks then once a day 60 capsule 0   Social History   Socioeconomic History  . Marital status: Single    Spouse name: Not on file  . Number of children: Not on file  . Years of education: Not on file  . Highest education level: Not on file  Occupational History  . Not on file  Tobacco Use  . Smoking status: Former Smoker    Types: Cigarettes    Quit date: 06/25/2017    Years since quitting: 2.7  . Smokeless tobacco: Never Used  Vaping Use  . Vaping Use: Every day  . Substances: Nicotine  Substance and Sexual Activity  . Alcohol use: No  . Drug use: Not Currently    Types:  Marijuana  . Sexual activity: Yes    Birth control/protection: Injection  Other Topics Concern  . Not on file  Social History Narrative  . Not on file   Social Determinants of Health   Financial Resource Strain: Low Risk   . Difficulty of Paying Living Expenses: Not hard at all  Food Insecurity: No Food Insecurity  . Worried About Programme researcher, broadcasting/film/video in the Last Year: Never true  . Ran Out of Food in the Last Year: Never true  Transportation Needs: No Transportation Needs  . Lack of Transportation (Medical): No  . Lack of Transportation (Non-Medical): No  Physical Activity: Sufficiently Active  . Days of Exercise per Week: 6 days  . Minutes of Exercise per Session: 150+ min  Stress: Stress Concern Present  . Feeling of Stress : Rather much  Social Connections: Moderately Isolated  . Frequency of Communication with Friends and Family: More than three times a week  . Frequency of Social Gatherings with Friends and Family: More than three times a week  . Attends Religious Services: More than 4 times per year  . Active Member of Clubs or Organizations: No  . Attends Banker Meetings: Never  . Marital Status: Never married  Intimate Partner Violence: Not At Risk  . Fear of Current or Ex-Partner: No  .  Emotionally Abused: No  . Physically Abused: No  . Sexually Abused: No   History reviewed. No pertinent family history.  OBJECTIVE:  Vitals:   04/08/20 0934  BP: (!) 106/57  Pulse: (!) 106  Resp: 20  Temp: 99 F (37.2 C)  SpO2: 98%     General appearance: alert; well-appearing, nontoxic; speaking in full sentences and tolerating own secretions HEENT: NCAT; Ears: EACs clear, TMs pearly gray; Eyes: PERRL.  EOM grossly intact.Nose: nares patent without rhinorrhea, Throat: oropharynx clear, tonsils non erythematous or enlarged, uvula midline  Neck: supple without LAD Lungs: unlabored respirations, symmetrical air entry; cough: absent; no respiratory distress;  CTAB Heart: regular rate and rhythm.  Skin: warm and dry Psychological: alert and cooperative; normal mood and affect   ASSESSMENT & PLAN:  1. Suspected COVID-19 virus infection   2. Viral URI with cough     Meds ordered this encounter  Medications  . benzonatate (TESSALON) 100 MG capsule    Sig: Take 1 capsule (100 mg total) by mouth every 8 (eight) hours.    Dispense:  21 capsule    Refill:  0    Order Specific Question:   Supervising Provider    Answer:   Eustace Moore [[id number]]   COVID testing ordered.  It will take between 5-7 days for test results.  Someone will contact you regarding abnormal results.    In the meantime: You should remain isolated in your home for 10 days from symptom onset AND greater than 72 hours after symptoms resolution (absence of fever without the use of fever-reducing medication and improvement in respiratory symptoms), whichever is longer Get plenty of rest and push fluids Tessalon Perles prescribed for cough Use OTC zyrtec for nasal congestion, runny nose, and/or sore throat Use OTC flonase for nasal congestion and runny nose Use medications daily for symptom relief Use OTC medications like ibuprofen or tylenol as needed fever or pain Call 911 or go to the ED if you have any new or worsening symptoms such as fever, worsening cough, shortness of breath, chest tightness, chest pain, turning blue, changes in mental status, etc...   Reviewed expectations re: course of current medical issues. Questions answered. Outlined signs and symptoms indicating need for more acute intervention. Patient verbalized understanding. After Visit Summary given.         Rennis Harding, PA-C 04/08/20 [id number]

## 2020-04-08 NOTE — Discharge Instructions (Signed)
COVID testing ordered.  It will take between 5-7 days for test results.  Someone will contact you regarding abnormal results.    In the meantime: You should remain isolated in your home for 10 days from symptom onset AND greater than 72 hours after symptoms resolution (absence of fever without the use of fever-reducing medication and improvement in respiratory symptoms), whichever is longer Get plenty of rest and push fluids Tessalon Perles prescribed for cough Use OTC zyrtec for nasal congestion, runny nose, and/or sore throat Use OTC flonase for nasal congestion and runny nose Use medications daily for symptom relief Use OTC medications like ibuprofen or tylenol as needed fever or pain Call 911 or go to the ED if you have any new or worsening symptoms such as fever, worsening cough, shortness of breath, chest tightness, chest pain, turning blue, changes in mental status, etc...  

## 2020-04-08 NOTE — ED Triage Notes (Signed)
Pt presents with c/o cough that began a couple days ago , took home test yesterday was negative

## 2020-04-09 LAB — NOVEL CORONAVIRUS, NAA: SARS-CoV-2, NAA: NOT DETECTED

## 2020-04-09 LAB — SARS-COV-2, NAA 2 DAY TAT

## 2020-05-12 ENCOUNTER — Other Ambulatory Visit: Payer: Medicaid Other

## 2020-06-11 ENCOUNTER — Encounter: Payer: Self-pay | Admitting: Adult Health

## 2020-06-11 ENCOUNTER — Other Ambulatory Visit (HOSPITAL_COMMUNITY)
Admission: RE | Admit: 2020-06-11 | Discharge: 2020-06-11 | Disposition: A | Discharge status: Home / Self Care | Payer: Medicaid Other | Source: Ambulatory Visit | Attending: Adult Health | Admitting: Adult Health

## 2020-06-11 ENCOUNTER — Ambulatory Visit (INDEPENDENT_AMBULATORY_CARE_PROVIDER_SITE_OTHER): Payer: Medicaid Other | Admitting: Adult Health

## 2020-06-11 ENCOUNTER — Other Ambulatory Visit: Payer: Self-pay

## 2020-06-11 VITALS — BP 110/63 | HR 85 | Ht 64.0 in | Wt 130.5 lb

## 2020-06-11 DIAGNOSIS — N898 Other specified noninflammatory disorders of vagina: Secondary | ICD-10-CM | POA: Insufficient documentation

## 2020-06-11 DIAGNOSIS — Z113 Encounter for screening for infections with a predominantly sexual mode of transmission: Secondary | ICD-10-CM | POA: Insufficient documentation

## 2020-06-11 DIAGNOSIS — N926 Irregular menstruation, unspecified: Secondary | ICD-10-CM | POA: Insufficient documentation

## 2020-06-11 DIAGNOSIS — Z3202 Encounter for pregnancy test, result negative: Secondary | ICD-10-CM | POA: Diagnosis not present

## 2020-06-11 LAB — POCT URINALYSIS DIPSTICK
Glucose, UA: NEGATIVE
Leukocytes, UA: NEGATIVE
Nitrite, UA: NEGATIVE

## 2020-06-11 LAB — POCT URINE PREGNANCY: Preg Test, Ur: NEGATIVE

## 2020-06-11 NOTE — Progress Notes (Signed)
  Subjective:     Patient ID: Stacy Richardson, female   DOB: [birth year], 19 y.o.   MRN: [id number]  HPI Stacy Richardson is a 19 year old black female, single G1P1 in complaining of spotting before her period and vaginal discharge and pain after sex, like a burning or itching.  PCP is Jiles Prows.   Review of Systems +Spotting before period +vaginal discharge +pain after sex, like burning and itching Reviewed past medical,surgical, social and family history. Reviewed medications and allergies.     Objective:   Physical Exam BP 110/63 (BP Location: Left Arm, Patient Position: Sitting, Cuff Size: Normal)   Pulse 85   Ht 5\' 4"  (1.626 m)   Wt 130 lb 8 oz (59.2 kg)   LMP 06/05/2020   BMI 22.40 kg/m UPT is negative, urine dipstick trace blood. Skin warm and dry.Pelvic: external genitalia is normal in appearance no lesions, vagina: brown blood without odor,urethra has no lesions or masses noted, cervix:smooth and bulbous, uterus: normal size, shape and contour, non tender, no masses felt, adnexa: no masses or tenderness noted. Bladder is non tender and no masses felt. CV swab obtained. Fall risk is low  Upstream - 06/11/20 1534      Pregnancy Intention Screening   Does the patient want to become pregnant in the next year? No    Does the patient's partner want to become pregnant in the next year? No    Would the patient like to discuss contraceptive options today? No      Contraception Wrap Up   Current Method No Method - Other Reason    End Method No Method - Other Reason    Contraception Counseling Provided No         I did encourage condom use and take 800 mcg folic acid daily   Examination chaperoned by 06/13/20 LPN  Assessment:     1. Pregnancy examination or test, negative result   2. Vaginal discharge CV swab sent for GC/CHL,trich,BV and yeast   3. Irregular bleeding CV swab sent  4. Screening examination for STD (sexually transmitted disease) Check HIV,RPR and Hepatitis C  antibody     Plan:     Will talk will labs back Take folic acid 800 mcg daily Use condoms

## 2020-06-13 LAB — CERVICOVAGINAL ANCILLARY ONLY
Bacterial Vaginitis (gardnerella): NEGATIVE
Candida Glabrata: NEGATIVE
Candida Vaginitis: NEGATIVE
Chlamydia: NEGATIVE
Comment: NEGATIVE
Comment: NEGATIVE
Comment: NEGATIVE
Comment: NEGATIVE
Comment: NEGATIVE
Comment: NORMAL
Neisseria Gonorrhea: NEGATIVE
Trichomonas: NEGATIVE

## 2020-09-17 ENCOUNTER — Other Ambulatory Visit: Payer: Self-pay

## 2020-09-17 ENCOUNTER — Other Ambulatory Visit (HOSPITAL_COMMUNITY)
Admission: RE | Admit: 2020-09-17 | Discharge: 2020-09-17 | Disposition: A | Discharge status: Home / Self Care | Payer: Medicaid Other | Source: Ambulatory Visit | Attending: Obstetrics & Gynecology | Admitting: Obstetrics & Gynecology

## 2020-09-17 ENCOUNTER — Other Ambulatory Visit (INDEPENDENT_AMBULATORY_CARE_PROVIDER_SITE_OTHER): Payer: Medicaid Other | Admitting: *Deleted

## 2020-09-17 DIAGNOSIS — N898 Other specified noninflammatory disorders of vagina: Secondary | ICD-10-CM | POA: Insufficient documentation

## 2020-09-17 NOTE — Progress Notes (Signed)
Chart reviewed for nurse visit. Agree with plan of care.  Allina Riches A, NP 09/17/2020 4:37 PM  

## 2020-09-17 NOTE — Progress Notes (Signed)
   NURSE VISIT- VAGINITIS/STD/POC  SUBJECTIVE:  Stacy Richardson is a 20 y.o. G1P1001 GYN patientfemale here for a vaginal swab for vaginitis screening.  She reports the following symptoms: discharge described as white for unknown days. Denies abnormal vaginal bleeding, significant pelvic pain, fever, or UTI symptoms.  OBJECTIVE:  There were no vitals taken for this visit.  Appears well, in no apparent distress  ASSESSMENT: Vaginal swab for vaginitis screening  PLAN: Self-collected vaginal probe for Gonorrhea, Chlamydia, Trichomonas, Bacterial Vaginosis, Yeast sent to lab Treatment: to be determined once results are received Follow-up as needed if symptoms persist/worsen, or new symptoms develop  [name] Dawley  09/17/2020 3:46 PM

## 2020-09-18 ENCOUNTER — Telehealth: Payer: Self-pay

## 2020-09-18 NOTE — Telephone Encounter (Signed)
Pt called stating that her stomach is really bothering her and she did a self swab yesterday and is wanting to know if the order can be rushed

## 2020-09-19 ENCOUNTER — Other Ambulatory Visit: Payer: Self-pay | Admitting: Adult Health

## 2020-09-19 LAB — CERVICOVAGINAL ANCILLARY ONLY
Bacterial Vaginitis (gardnerella): POSITIVE — AB
Candida Glabrata: NEGATIVE
Candida Vaginitis: NEGATIVE
Chlamydia: NEGATIVE
Comment: NEGATIVE
Comment: NEGATIVE
Comment: NEGATIVE
Comment: NEGATIVE
Comment: NEGATIVE
Comment: NORMAL
Neisseria Gonorrhea: NEGATIVE
Trichomonas: NEGATIVE

## 2020-09-19 MED ORDER — METRONIDAZOLE 500 MG PO TABS
500.0000 mg | ORAL_TABLET | Freq: Two times a day (BID) | ORAL | 0 refills | Status: DC
Start: 2020-09-19 — End: 2020-10-02

## 2020-09-19 NOTE — Progress Notes (Signed)
+  BV on vaginal swab will  rx flagyl,no sex or alcohol during treatment  °

## 2020-10-02 ENCOUNTER — Telehealth: Payer: Self-pay | Admitting: Adult Health

## 2020-10-02 MED ORDER — FLUCONAZOLE 150 MG PO TABS
ORAL_TABLET | ORAL | 1 refills | Status: DC
Start: 2020-10-02 — End: 2020-10-13

## 2020-10-02 MED ORDER — METRONIDAZOLE 0.75 % VA GEL: 1.0000 | Freq: Every day | VAGINAL | 0 refills | Status: DC

## 2020-10-02 NOTE — Telephone Encounter (Signed)
Pt states she's not able to handle taking the meds we ordered for the BV, states she can't keep it down Is there anything else we could order for her She's also requesting something for yeast infection, states she usually gets one after antibiotics  Please advise & notify    [facility]

## 2020-10-02 NOTE — Telephone Encounter (Signed)
Will rx metrogel and diflucan

## 2020-10-12 ENCOUNTER — Other Ambulatory Visit: Payer: Self-pay | Admitting: Adult Health

## 2020-10-22 ENCOUNTER — Emergency Department (HOSPITAL_COMMUNITY)
Admission: EM | Admit: 2020-10-22 | Discharge: 2020-10-22 | Disposition: A | Discharge status: Home / Self Care | Payer: Medicaid Other | Attending: Emergency Medicine | Admitting: Emergency Medicine

## 2020-10-22 ENCOUNTER — Encounter (HOSPITAL_COMMUNITY): Payer: Self-pay | Admitting: *Deleted

## 2020-10-22 ENCOUNTER — Other Ambulatory Visit: Payer: Self-pay

## 2020-10-22 DIAGNOSIS — Z87891 Personal history of nicotine dependence: Secondary | ICD-10-CM | POA: Diagnosis not present

## 2020-10-22 DIAGNOSIS — R059 Cough, unspecified: Secondary | ICD-10-CM | POA: Diagnosis not present

## 2020-10-22 DIAGNOSIS — H9202 Otalgia, left ear: Secondary | ICD-10-CM | POA: Diagnosis present

## 2020-10-22 DIAGNOSIS — H66002 Acute suppurative otitis media without spontaneous rupture of ear drum, left ear: Secondary | ICD-10-CM | POA: Diagnosis not present

## 2020-10-22 DIAGNOSIS — J3489 Other specified disorders of nose and nasal sinuses: Secondary | ICD-10-CM | POA: Diagnosis not present

## 2020-10-22 MED ORDER — HYDROCODONE-ACETAMINOPHEN 5-325 MG PO TABS
1.0000 | ORAL_TABLET | Freq: Once | ORAL | Status: AC
  Administered 2020-10-22: 1 via ORAL
  Filled 2020-10-22: qty 1

## 2020-10-22 MED ORDER — AMOXICILLIN 250 MG/5ML PO SUSR
500.0000 mg | Freq: Two times a day (BID) | ORAL | 0 refills | Status: DC
Start: 2020-10-22 — End: 2020-11-13

## 2020-10-22 MED ORDER — IBUPROFEN 100 MG/5ML PO SUSP
400.0000 mg | Freq: Once | ORAL | Status: AC
  Administered 2020-10-22: 400 mg via ORAL
  Filled 2020-10-22: qty 20

## 2020-10-22 MED ORDER — AMOXICILLIN 250 MG/5ML PO SUSR
500.0000 mg | Freq: Once | ORAL | Status: AC
  Administered 2020-10-22: 500 mg via ORAL
  Filled 2020-10-22: qty 10

## 2020-10-22 NOTE — ED Notes (Signed)
Pt was called back triage multiple times, pt was in the bathroom for several minutes

## 2020-10-22 NOTE — ED Provider Notes (Signed)
[facility] EMERGENCY DEPARTMENT Provider Note   CSN: [id number] Arrival date & time: 10/22/20  2059     History Chief Complaint  Patient presents with  . Otalgia    Stacy Richardson is a 20 y.o. female.  The history is provided by the patient.  Otalgia Location:  Left Behind ear:  No abnormality Quality:  Aching Severity:  Severe Onset quality:  Gradual Duration:  1 day Timing:  Constant Progression:  Worsening Chronicity:  New Relieved by:  Nothing Worsened by:  Position Associated symptoms: congestion and cough   Associated symptoms: no ear discharge, no fever, no hearing loss and no tinnitus   Patient reports over the past day she has had cough, congestion and rhinorrhea.  Over the past several hours she has had increasing pain in the left ear.  No drainage.  She also reports mild swelling and pain to her tongue      Past Medical History:  Diagnosis Date  . Medical history non-contributory   . Pelvic inflammatory disease     Patient Active Problem List   Diagnosis Date Noted  . Irregular bleeding 06/11/2020  . Vaginal discharge 06/11/2020  . Pregnancy examination or test, negative result 06/11/2020  . Screening examination for STD (sexually transmitted disease) 06/11/2020  . Breakthrough bleeding on depo provera 10/05/2019  . Abnormal uterine bleeding (AUB) 09/21/2019  . History of PID 09/21/2019  . Vaginal bleeding 09/10/2019  . Status post vacuum-assisted vaginal delivery 01/28/2018  . PROM (premature rupture of membranes) 01/27/2018    Past Surgical History:  Procedure Laterality Date  . NO PAST SURGERIES       OB History    Gravida  1   Para  1   Term  1   Preterm      AB      Living  1     SAB      IAB      Ectopic      Multiple  0   Live Births  1           History reviewed. No pertinent family history.  Social History   Tobacco Use  . Smoking status: Former Smoker    Types: Cigarettes    Quit date: 06/25/2017     Years since quitting: 3.3  . Smokeless tobacco: Never Used  Vaping Use  . Vaping Use: Every day  . Substances: Nicotine  Substance Use Topics  . Alcohol use: No  . Drug use: Not Currently    Types: Marijuana    Home Medications Prior to Admission medications   Medication Sig Start Date End Date Taking? Authorizing Provider  amoxicillin (AMOXIL) 250 MG/5ML suspension Take 10 mLs (500 mg total) by mouth 2 (two) times daily. 10/22/20  Yes Zadie Rhine, MD    Allergies    Patient has no known allergies.  Review of Systems   Review of Systems  Constitutional: Negative for fever.  HENT: Positive for congestion and ear pain. Negative for dental problem, ear discharge, hearing loss and tinnitus.   Respiratory: Positive for cough.     Physical Exam Updated Vital Signs BP 92/74 (BP Location: Right Arm)   Pulse (!) 112   Temp 98.5 F (36.9 C) (Oral)   Resp (!) 24   Ht 1.626 m (5\' 4" )   Wt 54.9 kg   LMP 09/28/2020   SpO2 100%   BMI 20.77 kg/m   Physical Exam CONSTITUTIONAL: Well developed/well nourished, anxious HEAD: Normocephalic/atraumatic EYES: EOMI/PERRL ENMT:  Mucous membranes moist, uvula midline without erythema or exudates, no angioedema is noted.  No obvious dental abscess.  No obvious edema noted to the tongue.  Right TM clear and intact.  Left TM is erythematous and bulging.  Left TM is intact NECK: supple no meningeal signs CV: S1/S2 noted, no murmurs/rubs/gallops noted LUNGS: Lungs are clear to auscultation bilaterally, no apparent distress ABDOMEN: soft NEURO: Pt is awake/alert/appropriate, moves all extremitiesx4.  No facial droop.   EXTREMITIES:  full ROM SKIN: warm, color normal PSYCH: Anxious  ED Results / Procedures / Treatments   Labs (all labs ordered are listed, but only abnormal results are displayed) Labs Reviewed - No data to display  EKG None  Radiology No results found.  Procedures Procedures   Medications Ordered in  ED Medications  HYDROcodone-acetaminophen (NORCO/VICODIN) 5-325 MG per tablet 1 tablet (1 tablet Oral Given 10/22/20 2332)  amoxicillin (AMOXIL) 250 MG/5ML suspension 500 mg (500 mg Oral Given 10/22/20 2332)  ibuprofen (ADVIL) 100 MG/5ML suspension 400 mg (400 mg Oral Given 10/22/20 2331)    ED Course  I have reviewed the triage vital signs and the nursing notes.    MDM Rules/Calculators/A&P                          Patient with obvious otitis media.  Patient requests elixir antibiotics.  Final Clinical Impression(s) / ED Diagnoses Final diagnoses:  Non-recurrent acute suppurative otitis media of left ear without spontaneous rupture of tympanic membrane    Rx / DC Orders ED Discharge Orders         Ordered    amoxicillin (AMOXIL) 250 MG/5ML suspension  2 times daily        10/22/20 2324           Zadie Rhine, MD 10/23/20 0000

## 2020-10-22 NOTE — ED Triage Notes (Signed)
Pt with left ear pain for past 3 hours and gradually getting worse.  Pt also c/o runny nose.  Taking benadryl at home for past 2 days.

## 2020-10-30 ENCOUNTER — Telehealth (INDEPENDENT_AMBULATORY_CARE_PROVIDER_SITE_OTHER): Payer: Self-pay

## 2020-10-30 ENCOUNTER — Encounter (INDEPENDENT_AMBULATORY_CARE_PROVIDER_SITE_OTHER): Payer: Self-pay | Admitting: Nurse Practitioner

## 2020-10-30 NOTE — Telephone Encounter (Signed)
Letter written and put in folder to be mailed out.

## 2020-11-13 ENCOUNTER — Encounter: Payer: Self-pay | Admitting: Advanced Practice Midwife

## 2020-11-13 ENCOUNTER — Ambulatory Visit (INDEPENDENT_AMBULATORY_CARE_PROVIDER_SITE_OTHER): Payer: Medicaid Other | Admitting: Advanced Practice Midwife

## 2020-11-13 ENCOUNTER — Other Ambulatory Visit: Payer: Self-pay

## 2020-11-13 VITALS — BP 100/65 | HR 102 | Ht 64.0 in | Wt 119.0 lb

## 2020-11-13 DIAGNOSIS — Z113 Encounter for screening for infections with a predominantly sexual mode of transmission: Secondary | ICD-10-CM

## 2020-11-13 DIAGNOSIS — B379 Candidiasis, unspecified: Secondary | ICD-10-CM | POA: Diagnosis not present

## 2020-11-13 NOTE — Progress Notes (Signed)
SUBJECTIVE:  20 y.o. female complains of white, curd-like, odorless, thick and vaginal erythema noted vaginal discharge for about a week . Denies abnormal vaginal bleeding or significant pelvic pain or fever. No UTI symptoms. Denies history of known exposure to STD. Requests STD screening  Patient's last menstrual period was 10/22/2020.  OBJECTIVE:  She appears well, afebrile. Abdomen: benign, soft, nontender, no masses. Pelvic Exam: VULVA: normal appearing vulva with no masses, tenderness or lesions, VAGINA: normal appearing vagina with normal color and discharge, no lesions, vaginal erythema , WET MOUNT done - results: hyphae, neg clue/trich/WBC. Urine dipstick: not done.  ASSESSMENT:  monilia vaginitis  PLAN:  Urine STD screen Treatment: Rx Diflucan 150mg  X2 ROV prn if symptoms persist or worsen.

## 2020-11-16 LAB — TRICHOMONAS VAGINALIS, PROBE AMP: Trich vag by NAA: NEGATIVE

## 2020-11-16 LAB — GC/CHLAMYDIA PROBE AMP
Chlamydia trachomatis, NAA: NEGATIVE
Neisseria Gonorrhoeae by PCR: NEGATIVE

## 2020-11-17 ENCOUNTER — Telehealth: Payer: Self-pay | Admitting: Advanced Practice Midwife

## 2020-11-17 ENCOUNTER — Other Ambulatory Visit: Payer: Self-pay | Admitting: Advanced Practice Midwife

## 2020-11-17 MED ORDER — FLUCONAZOLE 150 MG PO TABS
ORAL_TABLET | ORAL | 2 refills | Status: DC
Start: 2020-11-17 — End: 2021-01-07

## 2020-11-17 NOTE — Progress Notes (Signed)
Diflucan to pharmacy  

## 2020-11-17 NOTE — Telephone Encounter (Signed)
Pt was seen 11/13/20 & was told we were ordering meds, nothing has been sent to pharmacy Note says Diflucan  Please advise & notify pt

## 2020-11-17 NOTE — Telephone Encounter (Signed)
Best # to reach pt - [phone]

## 2021-01-07 ENCOUNTER — Other Ambulatory Visit: Payer: Self-pay

## 2021-01-07 ENCOUNTER — Ambulatory Visit
Admission: EM | Admit: 2021-01-07 | Discharge: 2021-01-07 | Disposition: A | Discharge status: Home / Self Care | Payer: Medicaid Other | Attending: Internal Medicine | Admitting: Internal Medicine

## 2021-01-07 ENCOUNTER — Encounter: Payer: Self-pay | Admitting: Emergency Medicine

## 2021-01-07 DIAGNOSIS — N760 Acute vaginitis: Secondary | ICD-10-CM | POA: Insufficient documentation

## 2021-01-07 MED ORDER — FLUCONAZOLE 150 MG PO TABS
ORAL_TABLET | ORAL | 2 refills | Status: DC
Start: 2021-01-07 — End: 2021-02-26

## 2021-01-07 NOTE — ED Triage Notes (Signed)
Abd cramps that feel like menstrual cramps after sex and having white vaginal discharge x 1 week.

## 2021-01-07 NOTE — ED Provider Notes (Signed)
[facility]    CSN: [id number] Arrival date & time: 01/07/21  1622      History   Chief Complaint Chief Complaint  Patient presents with   Abdominal Pain    HPI Stacy Richardson is a 20 y.o. female comes to the urgent care with 1 week history of thick whitish vaginal discharge.  Symptoms started abruptly and has been persistent over the past week.  No dysuria urgency or frequency.  Patient has some dyspareunia mostly deep dyspareunia.  She has some crampy abdominal pain with no nausea or vomiting.  Last menstrual period was 12/22/2020.  No abdominal distention.  She has single sexual partner and engages in unprotected sexual intercourse.  She has a history of vaginal yeast infection with the last infection May of this year.  No nausea or vomiting.  No vaginal rash  HPI  Past Medical History:  Diagnosis Date   Medical history non-contributory    Pelvic inflammatory disease     Patient Active Problem List   Diagnosis Date Noted   Irregular bleeding 06/11/2020   Vaginal discharge 06/11/2020   Pregnancy examination or test, negative result 06/11/2020   Screening examination for STD (sexually transmitted disease) 06/11/2020   Breakthrough bleeding on depo provera 10/05/2019   Abnormal uterine bleeding (AUB) 09/21/2019   History of PID 09/21/2019   Vaginal bleeding 09/10/2019   Status post vacuum-assisted vaginal delivery 01/28/2018   PROM (premature rupture of membranes) 01/27/2018    Past Surgical History:  Procedure Laterality Date   NO PAST SURGERIES      OB History     Gravida  1   Para  1   Term  1   Preterm      AB      Living  1      SAB      IAB      Ectopic      Multiple  0   Live Births  1            Home Medications    Prior to Admission medications   Medication Sig Start Date End Date Taking? Authorizing Provider  fluconazole (DIFLUCAN) 150 MG tablet 1 po stat; repeat in 3 days 01/07/21   Eilis Chestnutt, Britta Mccreedy, MD     Family History No family history on file.  Social History Social History   Tobacco Use   Smoking status: Former    Pack years: 0.00    Types: Cigarettes    Quit date: 06/25/2017    Years since quitting: 3.5   Smokeless tobacco: Never  Vaping Use   Vaping Use: Every day   Substances: Nicotine  Substance Use Topics   Alcohol use: No   Drug use: Not Currently    Types: Marijuana     Allergies   Patient has no known allergies.   Review of Systems Review of Systems As per HPI  Physical Exam Triage Vital Signs ED Triage Vitals  Enc Vitals Group     BP 01/07/21 1738 (!) 95/42     Pulse Rate 01/07/21 1738 82     Resp 01/07/21 1738 16     Temp 01/07/21 1738 98 F (36.7 C)     Temp Source 01/07/21 1738 Tympanic     SpO2 01/07/21 1738 98 %     Weight --      Height --      Head Circumference --      Peak Flow --  Pain Score 01/07/21 1744 0     Pain Loc --      Pain Edu? --      Excl. in GC? --    No data found.  Updated Vital Signs BP 98/64 (BP Location: Left Arm)   Pulse 82   Temp 98 F (36.7 C) (Tympanic)   Resp 16   LMP 12/22/2020   SpO2 98%   Visual Acuity Right Eye Distance:   Left Eye Distance:   Bilateral Distance:    Right Eye Near:   Left Eye Near:    Bilateral Near:     Physical Exam Vitals and nursing note reviewed.  Cardiovascular:     Rate and Rhythm: Normal rate and regular rhythm.  Pulmonary:     Effort: Pulmonary effort is normal.     Breath sounds: Normal breath sounds.  Abdominal:     General: Bowel sounds are normal.     Tenderness: There is abdominal tenderness in the suprapubic area. There is no right CVA tenderness, left CVA tenderness, guarding or rebound. Negative signs include Murphy's sign and McBurney's sign.  Neurological:     Mental Status: She is alert.     UC Treatments / Results  Labs (all labs ordered are listed, but only abnormal results are displayed) Labs Reviewed  CERVICOVAGINAL ANCILLARY  ONLY    EKG   Radiology No results found.  Procedures Procedures (including critical care time)  Medications Ordered in UC Medications - No data to display  Initial Impression / Assessment and Plan / UC Course  I have reviewed the triage vital signs and the nursing notes.  Pertinent labs & imaging results that were available during my care of the patient were reviewed by me and considered in my medical decision making (see chart for details).     1.  Acute vaginitis: Cervicovaginal swab for GC/chlamydia/trichomonas/vaginal yeast/bacterial vaginosis Fluconazole 50 mg x 1 dose to be repeated in 72 hours if no improvement in symptoms We will call patient with recommendations if labs are abnormal. Safe sex practices advised. Final Clinical Impressions(s) / UC Diagnoses   Final diagnoses:  Acute vaginitis     Discharge Instructions      Please take medications as prescribed We will call you with recommendations if labs abnormal If you see the lab results on MyChart before we get to it please give [place] a call Return if symptoms worsen.   ED Prescriptions     Medication Sig Dispense Auth. Provider   fluconazole (DIFLUCAN) 150 MG tablet 1 po stat; repeat in 3 days 2 tablet Zanylah Hardie, Britta Mccreedy, MD      PDMP not reviewed this encounter.   Merrilee Jansky, MD 01/07/21 [phone]

## 2021-01-07 NOTE — Discharge Instructions (Signed)
Please take medications as prescribed We will call you with recommendations if labs abnormal If you see the lab results on MyChart before we get to it please give [place] a call Return if symptoms worsen.

## 2021-01-08 LAB — CERVICOVAGINAL ANCILLARY ONLY
Bacterial Vaginitis (gardnerella): NEGATIVE
Candida Glabrata: NEGATIVE
Candida Vaginitis: POSITIVE — AB
Chlamydia: NEGATIVE
Comment: NEGATIVE
Comment: NEGATIVE
Comment: NEGATIVE
Comment: NEGATIVE
Comment: NEGATIVE
Comment: NORMAL
Neisseria Gonorrhea: NEGATIVE
Trichomonas: NEGATIVE

## 2021-02-09 ENCOUNTER — Other Ambulatory Visit: Payer: Self-pay

## 2021-02-09 ENCOUNTER — Other Ambulatory Visit (INDEPENDENT_AMBULATORY_CARE_PROVIDER_SITE_OTHER): Payer: Medicaid Other

## 2021-02-09 ENCOUNTER — Other Ambulatory Visit (HOSPITAL_COMMUNITY)
Admission: RE | Admit: 2021-02-09 | Discharge: 2021-02-09 | Disposition: A | Discharge status: Home / Self Care | Payer: Medicaid Other | Source: Ambulatory Visit | Attending: Obstetrics & Gynecology | Admitting: Obstetrics & Gynecology

## 2021-02-09 DIAGNOSIS — N898 Other specified noninflammatory disorders of vagina: Secondary | ICD-10-CM

## 2021-02-09 NOTE — Progress Notes (Addendum)
   NURSE VISIT- STD/Vaginitis  SUBJECTIVE:  Stacy Richardson is a 20 y.o. G1P1001 GYN patientfemale here for a vaginal swab for STD screen.  She reports the following symptoms: discharge described as white and creamy, local irritation, and pain for 1 week. Denies abnormal vaginal bleeding, significant pelvic pain, fever, or UTI symptoms.  OBJECTIVE:  There were no vitals taken for this visit.  Appears well, in no apparent distress  ASSESSMENT: Vaginal swab for  vaginitis & STD screening  PLAN: Self-collected vaginal probe for Gonorrhea, Chlamydia, Trichomonas, Bacterial Vaginosis, Yeast sent to lab Treatment: to be determined once results are received Follow-up as needed if symptoms persist/worsen, or new symptoms develop  Shreyas Piatkowski A Edgardo Petrenko  02/09/2021 3:51 PM   Chart reviewed for nurse visit. Agree with plan of care.  Cheral Marker, [place] 02/09/2021 4:44 PM

## 2021-02-11 LAB — CERVICOVAGINAL ANCILLARY ONLY
Bacterial Vaginitis (gardnerella): NEGATIVE
Candida Glabrata: NEGATIVE
Candida Vaginitis: NEGATIVE
Chlamydia: NEGATIVE
Comment: NEGATIVE
Comment: NEGATIVE
Comment: NEGATIVE
Comment: NEGATIVE
Comment: NEGATIVE
Comment: NORMAL
Neisseria Gonorrhea: NEGATIVE
Trichomonas: NEGATIVE

## 2021-02-26 ENCOUNTER — Ambulatory Visit (INDEPENDENT_AMBULATORY_CARE_PROVIDER_SITE_OTHER): Payer: Medicaid Other | Admitting: Adult Health

## 2021-02-26 ENCOUNTER — Other Ambulatory Visit: Payer: Self-pay

## 2021-02-26 ENCOUNTER — Encounter: Payer: Self-pay | Admitting: Adult Health

## 2021-02-26 ENCOUNTER — Other Ambulatory Visit (HOSPITAL_COMMUNITY)
Admission: RE | Admit: 2021-02-26 | Discharge: 2021-02-26 | Disposition: A | Discharge status: Home / Self Care | Payer: Medicaid Other | Source: Ambulatory Visit | Attending: Adult Health | Admitting: Adult Health

## 2021-02-26 VITALS — BP 112/71 | HR 81 | Ht 64.0 in | Wt 117.5 lb

## 2021-02-26 DIAGNOSIS — Z113 Encounter for screening for infections with a predominantly sexual mode of transmission: Secondary | ICD-10-CM | POA: Diagnosis not present

## 2021-02-26 NOTE — Progress Notes (Signed)
  Subjective:     Patient ID: Stacy Richardson, female   DOB: 2000-10-10, 20 y.o.   MRN: [id number]  HPI Malita is a 20 year old black female,single, G1P1001, in requesting STD testing. PCP is Jiles Prows NP  Review of Systems Denies any odor, has vaginal discharge and some itching Has female partner currently Reviewed past medical,surgical, social and family history. Reviewed medications and allergies.     Objective:   Physical Exam BP 112/71 (BP Location: Left Arm, Patient Position: Sitting, Cuff Size: Normal)   Pulse 81   Ht 5\' 4"  (1.626 m)   Wt 117 lb 8 oz (53.3 kg)   LMP 02/15/2021 (Approximate)   BMI 20.17 kg/m  Skin warm and dry.Pelvic: external genitalia is normal in appearance no lesions, vagina: white discharge without odor,urethra has no lesions or masses noted, cervix:smooth and bulbous, uterus: normal size, shape and contour, non tender, no masses felt, adnexa: no masses or tenderness noted. Bladder is non tender and no masses felt. CV swab obtained.   Fall risk is low  Upstream - 02/26/21 1159       Pregnancy Intention Screening   Does the patient want to become pregnant in the next year? No    Does the patient's partner want to become pregnant in the next year? No    Would the patient like to discuss contraceptive options today? No      Contraception Wrap Up   Current Method Female Condom    End Method Female Condom    Contraception Counseling Provided No             Examination chaperoned by 04/28/21 LPN Assessment:     1. Screening examination for STD (sexually transmitted disease) CV swab sent for GC/CHL,trich and BV ,yeast Declines HIV    Plan:     Return in about 2-4 weeks for pap and physical with me

## 2021-02-27 LAB — CERVICOVAGINAL ANCILLARY ONLY
Bacterial Vaginitis (gardnerella): NEGATIVE
Candida Glabrata: NEGATIVE
Candida Vaginitis: NEGATIVE
Chlamydia: NEGATIVE
Comment: NEGATIVE
Comment: NEGATIVE
Comment: NEGATIVE
Comment: NEGATIVE
Comment: NEGATIVE
Comment: NORMAL
Neisseria Gonorrhea: NEGATIVE
Trichomonas: NEGATIVE

## 2021-04-01 ENCOUNTER — Ambulatory Visit (INDEPENDENT_AMBULATORY_CARE_PROVIDER_SITE_OTHER): Payer: Medicaid Other | Admitting: Adult Health

## 2021-04-01 ENCOUNTER — Other Ambulatory Visit (HOSPITAL_COMMUNITY)
Admission: RE | Admit: 2021-04-01 | Discharge: 2021-04-01 | Disposition: A | Discharge status: Home / Self Care | Payer: Medicaid Other | Source: Ambulatory Visit | Attending: Adult Health | Admitting: Adult Health

## 2021-04-01 ENCOUNTER — Other Ambulatory Visit: Payer: Self-pay

## 2021-04-01 ENCOUNTER — Encounter: Payer: Self-pay | Admitting: Adult Health

## 2021-04-01 VITALS — BP 110/65 | HR 84 | Ht 64.0 in | Wt 115.0 lb

## 2021-04-01 DIAGNOSIS — Z0000 Encounter for general adult medical examination without abnormal findings: Secondary | ICD-10-CM | POA: Diagnosis not present

## 2021-04-01 DIAGNOSIS — Z113 Encounter for screening for infections with a predominantly sexual mode of transmission: Secondary | ICD-10-CM | POA: Insufficient documentation

## 2021-04-01 DIAGNOSIS — Z01419 Encounter for gynecological examination (general) (routine) without abnormal findings: Secondary | ICD-10-CM | POA: Insufficient documentation

## 2021-04-01 NOTE — Progress Notes (Signed)
Patient ID: Stacy Richardson, female   DOB: 02/22/2001, 20 y.o.   MRN: [id number] History of Present Illness: Stacy Richardson is a 20 year old black female,single, G1P1, in for a well woman gyn exam and pap.   Current Medications, Allergies, Past Medical History, Past Surgical History, Family History and Social History were reviewed in [name] record.     Review of Systems: Patient denies any headaches, hearing loss, fatigue, blurred vision, shortness of breath, chest pain, abdominal pain, problems with bowel movements, urination, or intercourse. No joint pain or mood swings.  Has some pain with period first 1-2 days   Physical Exam:BP 110/65 (BP Location: Left Arm, Patient Position: Sitting, Cuff Size: Normal)   Pulse 84   Ht 5\' 4"  (1.626 m)   Wt 115 lb (52.2 kg)   LMP 03/13/2021   BMI 19.74 kg/m   General:  Well developed, well nourished, no acute distress Skin:  Warm and dry Neck:  Midline trachea, normal thyroid, good ROM, no lymphadenopathy Lungs; Clear to auscultation bilaterally Breast:  No dominant palpable mass, retraction, or nipple discharge Cardiovascular: Regular rate and rhythm Abdomen:  Soft, non tender, no hepatosplenomegaly Pelvic:  External genitalia is normal in appearance, no lesions.  The vagina is normal in appearance. Urethra has no lesions or masses. The cervix is bulbous.Pap with GC/CHL performed.  Uterus is felt to be normal size, shape, and contour.  No adnexal masses or tenderness noted.Bladder is non tender, no masses felt. Extremities/musculoskeletal:  No swelling or varicosities noted, no clubbing or cyanosis Psych:  No mood changes, alert and cooperative,seems happy  AA is 3 Fall risk is low Depression screen [facility] 2/9 04/01/2021 09/10/2019  Decreased Interest 0 0  Down, Depressed, Hopeless 0 0  PHQ - 2 Score 0 0  Altered sleeping 0 -  Tired, decreased energy 0 -  Change in appetite 1 -  Feeling bad or failure about yourself  0 -   Trouble concentrating 0 -  Moving slowly or fidgety/restless 0 -  Suicidal thoughts 0 -  PHQ-9 Score 1 -    GAD 7 : Generalized Anxiety Score 04/01/2021  Nervous, Anxious, on Edge 0  Control/stop worrying 0  Worry too much - different things 1  Trouble relaxing 0  Restless 0  Easily annoyed or irritable 0  Afraid - awful might happen 0  Total GAD 7 Score 1      Upstream - 04/01/21 0910       Pregnancy Intention Screening   Does the patient want to become pregnant in the next year? Ok Either Way    Does the patient's partner want to become pregnant in the next year? Ok Either Way    Would the patient like to discuss contraceptive options today? No      Contraception Wrap Up   Current Method Female Condom    End Method Female Condom           .Examination chaperoned by 06/01/21 LPN   Impression and Plan: 1. Routine general medical examination at a health care facility Pap sent  2. Encounter for gynecological examination with Papanicolaou smear of cervix Pap sent with GC/CHL Physical in 1 year Pap in 3 years if normal She uses condoms and knows about Plan B

## 2021-04-02 LAB — CYTOLOGY - PAP
Adequacy: ABSENT
Chlamydia: NEGATIVE
Comment: NEGATIVE
Comment: NORMAL
Diagnosis: NEGATIVE
Neisseria Gonorrhea: NEGATIVE

## 2021-05-11 ENCOUNTER — Other Ambulatory Visit: Payer: Medicaid Other

## 2021-05-18 ENCOUNTER — Other Ambulatory Visit (INDEPENDENT_AMBULATORY_CARE_PROVIDER_SITE_OTHER): Payer: Medicaid Other

## 2021-05-18 ENCOUNTER — Other Ambulatory Visit: Payer: Self-pay

## 2021-05-18 VITALS — BP 94/54 | HR 96 | Ht 64.0 in | Wt 121.0 lb

## 2021-05-18 DIAGNOSIS — Z113 Encounter for screening for infections with a predominantly sexual mode of transmission: Secondary | ICD-10-CM

## 2021-05-18 DIAGNOSIS — Z3200 Encounter for pregnancy test, result unknown: Secondary | ICD-10-CM

## 2021-05-18 LAB — POCT URINE PREGNANCY: Preg Test, Ur: POSITIVE — AB

## 2021-05-18 MED ORDER — PROMETHAZINE HCL 25 MG PO TABS
25.0000 mg | ORAL_TABLET | Freq: Four times a day (QID) | ORAL | 1 refills | Status: DC | PRN
Start: 2021-05-18 — End: 2021-08-06

## 2021-05-18 NOTE — Addendum Note (Signed)
Addended by: Cyril Mourning A on: 05/18/2021 05:03 PM   Modules accepted: Orders

## 2021-05-18 NOTE — Progress Notes (Signed)
Chart reviewed for nurse visit. Agree with plan of care. Rx phenergan Adline Potter, NP 05/18/2021 5:02 PM

## 2021-05-18 NOTE — Progress Notes (Signed)
   NURSE VISIT- PREGNANCY CONFIRMATION   SUBJECTIVE:  Stacy Richardson is a 20 y.o. G78P1001 female at [redacted]w[redacted]d by certain LMP of Patient's last menstrual period was 04/11/2021 (exact date). Here for pregnancy confirmation.  Home pregnancy test: positive x 1   She reports nausea.  She is not taking prenatal vitamins.    OBJECTIVE:  BP (!) 94/54 (BP Location: Right Arm, Patient Position: Sitting, Cuff Size: Normal)   Pulse 96   Ht 5\' 4"  (1.626 m)   Wt 121 lb (54.9 kg)   LMP 04/11/2021 (Exact Date)   BMI 20.77 kg/m   Appears well, in no apparent distress  Results for orders placed or performed in visit on 05/18/21 (from the past 24 hour(s))  POCT urine pregnancy   Collection Time: 05/18/21  4:07 PM  Result Value Ref Range   Preg Test, Ur Positive (A) Negative    ASSESSMENT: Positive pregnancy test, [redacted]w[redacted]d by LMP , Urine sent for GC/CH and Trich per pt request for routine STD testing, no symptoms  PLAN: Schedule for dating ultrasound in 2-4 weeks Prenatal vitamins: plans to begin OTC ASAP   Nausea medicines: requested-note routed to [redacted]w[redacted]d to send prescription   OB packet given: Yes  Anayia Eugene A Kenzlie Disch  05/18/2021 4:11 PM

## 2021-05-21 LAB — GC/CHLAMYDIA PROBE AMP
Chlamydia trachomatis, NAA: NEGATIVE
Neisseria Gonorrhoeae by PCR: NEGATIVE

## 2021-05-21 LAB — TRICHOMONAS VAGINALIS, PROBE AMP: Trich vag by NAA: NEGATIVE

## 2021-06-09 ENCOUNTER — Other Ambulatory Visit: Payer: Self-pay | Admitting: Obstetrics & Gynecology

## 2021-06-09 DIAGNOSIS — O3680X0 Pregnancy with inconclusive fetal viability, not applicable or unspecified: Secondary | ICD-10-CM

## 2021-06-10 ENCOUNTER — Other Ambulatory Visit: Payer: Self-pay

## 2021-06-10 ENCOUNTER — Ambulatory Visit (INDEPENDENT_AMBULATORY_CARE_PROVIDER_SITE_OTHER): Payer: Medicaid Other

## 2021-06-10 DIAGNOSIS — O3680X0 Pregnancy with inconclusive fetal viability, not applicable or unspecified: Secondary | ICD-10-CM

## 2021-06-10 DIAGNOSIS — Z3A08 8 weeks gestation of pregnancy: Secondary | ICD-10-CM

## 2021-06-10 NOTE — Progress Notes (Signed)
[place] 8+4 wks,single IUP with YS,positive FHT 173 bpm,CRL 25.28 mm,normal left ovary,two simple right ovarian cysts (#1) 3.2 x 2.9 x 2.5 cm,(#2) 2.8 nx 1.5 x 1.8 cm

## 2021-06-28 NOTE — L&D Delivery Note (Signed)
OB[facility]N [facility] Delivery Note  Stacy Richardson is a 21 y.o. [id number] s/p SVD at [redacted]w[redacted]d. She was admitted for active labor after SROM at 0530.   ROM: 7h 31m with pink fluid GBS Status: Positive Maximum Maternal Temperature: 98.4  Labor Progress: Patient progressed to complete without augmentation.  She received one dose of IV fentanyl and delivered as below with sister and staff support.   Delivery Date/Time: January 14, 2022 at 1232 Delivery: Called to room and patient was complete and pushing. Head delivered in LOA. No nuchal cord present. Shoulder and body delivered in usual fashion. Infant with spontaneous cry, and  dried and stimulated by provider before being placed on mother's abdomen for continued tactile stimulation by nurses. Cord clamped x 2 after 1-minute delay, and cut by patient's sister. Cord blood drawn. Placenta delivered spontaneously with gentle cord traction by medical student, Gae Gallop, and provider guidance. Fundus firm with massage and Pitocin. Labia, perineum, vagina, and cervix inspected and without lacerations.   Placenta: Intact, 3VC, Disposal Complications: None Lacerations: None EBL: 104 Analgesia: IV prior to delivery  Postpartum Planning [X]  Follow Up Message Sent  Infant: Girl-[name]  APGARs 9, 9  3030g, 6lb 10.9oz  MSN, CNM Advanced Practice Provider, [facility]

## 2021-07-08 ENCOUNTER — Other Ambulatory Visit: Payer: Self-pay | Admitting: Obstetrics & Gynecology

## 2021-07-08 DIAGNOSIS — Z3682 Encounter for antenatal screening for nuchal translucency: Secondary | ICD-10-CM

## 2021-07-09 ENCOUNTER — Ambulatory Visit: Payer: Medicaid Other | Admitting: *Deleted

## 2021-07-09 ENCOUNTER — Other Ambulatory Visit: Payer: Self-pay

## 2021-07-09 ENCOUNTER — Encounter: Payer: Self-pay | Admitting: Advanced Practice Midwife

## 2021-07-09 ENCOUNTER — Ambulatory Visit: Payer: Medicaid Other

## 2021-07-09 ENCOUNTER — Ambulatory Visit (INDEPENDENT_AMBULATORY_CARE_PROVIDER_SITE_OTHER): Payer: Medicaid Other | Admitting: Advanced Practice Midwife

## 2021-07-09 ENCOUNTER — Encounter: Payer: Medicaid Other | Admitting: Advanced Practice Midwife

## 2021-07-09 VITALS — BP 117/64 | HR 91 | Wt 120.0 lb

## 2021-07-09 DIAGNOSIS — Z3480 Encounter for supervision of other normal pregnancy, unspecified trimester: Secondary | ICD-10-CM

## 2021-07-09 DIAGNOSIS — Z3A12 12 weeks gestation of pregnancy: Secondary | ICD-10-CM

## 2021-07-09 DIAGNOSIS — Z3490 Encounter for supervision of normal pregnancy, unspecified, unspecified trimester: Secondary | ICD-10-CM | POA: Insufficient documentation

## 2021-07-09 LAB — POCT URINALYSIS DIPSTICK OB
Blood, UA: NEGATIVE
Glucose, UA: NEGATIVE
Ketones, UA: NEGATIVE
Leukocytes, UA: NEGATIVE
Nitrite, UA: NEGATIVE
POC,PROTEIN,UA: NEGATIVE

## 2021-07-09 MED ORDER — OB COMPLETE PETITE 35-5-1-200 MG PO CAPS: 1.0000 | ORAL_CAPSULE | Freq: Every day | ORAL | 11 refills | Status: DC

## 2021-07-09 NOTE — Patient Instructions (Signed)
Buffie Daphine DeutscherMartin, I greatly value your feedback.  If you receive a survey following your visit with us today, we appreciate you taking the time to fill it out.  Thanks, Cathie BeamsFran Cresenzo-Dishmon, DNP, CNM  [facility] is now [facility] ([address] [place], [place] [id number]) Entrance located off of [address] Free 24/7 valet parking   Nausea & Vomiting Have saltine crackers or pretzels by your bed and eat a few bites before you raise your head out of bed in the morning Eat small frequent meals throughout the day instead of large meals Drink plenty of fluids throughout the day to stay hydrated, just don't drink a lot of fluids with your meals.  This can make your stomach fill up faster making you feel sick Do not brush your teeth right after you eat Products with real ginger are good for nausea, like ginger ale and ginger hard candy Make sure it says made with real ginger! Sucking on sour candy like lemon heads is also good for nausea If your prenatal vitamins make you nauseated, take them at night so you will sleep through the nausea Sea Bands If you feel like you need medicine for the nausea & vomiting please let us know If you are unable to keep any fluids or food down please let us know   Constipation Drink plenty of fluid, preferably water, throughout the day Eat foods high in fiber such as fruits, vegetables, and grains Exercise, such as walking, is a good way to keep your bowels regular Drink warm fluids, especially warm prune juice, or decaf coffee Eat a 1/2 cup of real oatmeal (not instant), 1/2 cup applesauce, and 1/2-1 cup warm prune juice every day If needed, you may take Colace (docusate sodium) stool softener once or twice a day to help keep the stool soft.  If you still are having problems with constipation, you may take Miralax once daily as needed to help keep your bowels regular.   Home Blood Pressure Monitoring for  Patients   Your provider has recommended that you check your blood pressure (BP) at least once a week at home. If you do not have a blood pressure cuff at home, one will be provided for you. Contact your provider if you have not received your monitor within 1 week.   Helpful Tips for Accurate Home Blood Pressure Checks  Don't smoke, exercise, or drink caffeine 30 minutes before checking your BP Use the restroom before checking your BP (a full bladder can raise your pressure) Relax in a comfortable upright chair Feet on the ground Left arm resting comfortably on a flat surface at the level of your heart Legs uncrossed Back supported Sit quietly and don't talk Place the cuff on your bare arm Adjust snuggly, so that only two fingertips can fit between your skin and the top of the cuff Check 2 readings separated by at least one minute Keep a log of your BP readings For a visual, please reference this diagram: http://ccnc.care/bpdiagram  Provider Name: [facility]     Phone: [phone]  Zone 1: ALL CLEAR  Continue to monitor your symptoms:  BP reading is less than 140 (top number) or less than 90 (bottom number)  No right upper stomach pain No headaches or seeing spots No feeling nauseated or throwing up No swelling in face and hands  Zone 2: CAUTION Call your doctor's office for any of the following:  BP  reading is greater than 140 (top number) or greater than 90 (bottom number)  Stomach pain under your ribs in the middle or right side Headaches or seeing spots Feeling nauseated or throwing up Swelling in face and hands  Zone 3: EMERGENCY  Seek immediate medical care if you have any of the following:  BP reading is greater than160 (top number) or greater than 110 (bottom number) Severe headaches not improving with Tylenol Serious difficulty catching your breath Any worsening symptoms from Zone 2    First Trimester of Pregnancy The first trimester of pregnancy is from  week 1 until the end of week 12 (months 1 through 3). A week after a sperm fertilizes an egg, the egg will implant on the wall of the uterus. This embryo will begin to develop into a baby. Genes from you and your partner are forming the baby. The female genes determine whether the baby is a boy or a girl. At 6-8 weeks, the eyes and face are formed, and the heartbeat can be seen on ultrasound. At the end of 12 weeks, all the baby's organs are formed.  Now that you are pregnant, you will want to do everything you can to have a healthy baby. Two of the most important things are to get good prenatal care and to follow your health care provider's instructions. Prenatal care is all the medical care you receive before the baby's birth. This care will help prevent, find, and treat any problems during the pregnancy and childbirth. BODY CHANGES Your body goes through many changes during pregnancy. The changes vary from woman to woman.  You may gain or lose a couple of pounds at first. You may feel sick to your stomach (nauseous) and throw up (vomit). If the vomiting is uncontrollable, call your health care provider. You may tire easily. You may develop headaches that can be relieved by medicines approved by your health care provider. You may urinate more often. Painful urination may mean you have a bladder infection. You may develop heartburn as a result of your pregnancy. You may develop constipation because certain hormones are causing the muscles that push waste through your intestines to slow down. You may develop hemorrhoids or swollen, bulging veins (varicose veins). Your breasts may begin to grow larger and become tender. Your nipples may stick out more, and the tissue that surrounds them (areola) may become darker. Your gums may bleed and may be sensitive to brushing and flossing. Dark spots or blotches (chloasma, mask of pregnancy) may develop on your face. This will likely fade after the baby is  born. Your menstrual periods will stop. You may have a loss of appetite. You may develop cravings for certain kinds of food. You may have changes in your emotions from day to day, such as being excited to be pregnant or being concerned that something may go wrong with the pregnancy and baby. You may have more vivid and strange dreams. You may have changes in your hair. These can include thickening of your hair, rapid growth, and changes in texture. Some women also have hair loss during or after pregnancy, or hair that feels dry or thin. Your hair will most likely return to normal after your baby is born. WHAT TO EXPECT AT YOUR PRENATAL VISITS During a routine prenatal visit: You will be weighed to make sure you and the baby are growing normally. Your blood pressure will be taken. Your abdomen will be measured to track your baby's growth. The fetal heartbeat  will be listened to starting around week 10 or 12 of your pregnancy. Test results from any previous visits will be discussed. Your health care provider may ask you: How you are feeling. If you are feeling the baby move. If you have had any abnormal symptoms, such as leaking fluid, bleeding, severe headaches, or abdominal cramping. If you have any questions. Other tests that may be performed during your first trimester include: Blood tests to find your blood type and to check for the presence of any previous infections. They will also be used to check for low iron levels (anemia) and Rh antibodies. Later in the pregnancy, blood tests for diabetes will be done along with other tests if problems develop. Urine tests to check for infections, diabetes, or protein in the urine. An ultrasound to confirm the proper growth and development of the baby. An amniocentesis to check for possible genetic problems. Fetal screens for spina bifida and Down syndrome. You may need other tests to make sure you and the baby are doing well. HOME CARE  INSTRUCTIONS  Medicines Follow your health care provider's instructions regarding medicine use. Specific medicines may be either safe or unsafe to take during pregnancy. Take your prenatal vitamins as directed. If you develop constipation, try taking a stool softener if your health care provider approves. Diet Eat regular, well-balanced meals. Choose a variety of foods, such as meat or vegetable-based protein, fish, milk and low-fat dairy products, vegetables, fruits, and whole grain breads and cereals. Your health care provider will help you determine the amount of weight gain that is right for you. Avoid raw meat and uncooked cheese. These carry germs that can cause birth defects in the baby. Eating four or five small meals rather than three large meals a day may help relieve nausea and vomiting. If you start to feel nauseous, eating a few soda crackers can be helpful. Drinking liquids between meals instead of during meals also seems to help nausea and vomiting. If you develop constipation, eat more high-fiber foods, such as fresh vegetables or fruit and whole grains. Drink enough fluids to keep your urine clear or pale yellow. Activity and Exercise Exercise only as directed by your health care provider. Exercising will help you: Control your weight. Stay in shape. Be prepared for labor and delivery. Experiencing pain or cramping in the lower abdomen or low back is a good sign that you should stop exercising. Check with your health care provider before continuing normal exercises. Try to avoid standing for long periods of time. Move your legs often if you must stand in one place for a long time. Avoid heavy lifting. Wear low-heeled shoes, and practice good posture. You may continue to have sex unless your health care provider directs you otherwise. Relief of Pain or Discomfort Wear a good support bra for breast tenderness.   Take warm sitz baths to soothe any pain or discomfort caused by  hemorrhoids. Use hemorrhoid cream if your health care provider approves.   Rest with your legs elevated if you have leg cramps or low back pain. If you develop varicose veins in your legs, wear support hose. Elevate your feet for 15 minutes, 3-4 times a day. Limit salt in your diet. Prenatal Care Schedule your prenatal visits by the twelfth week of pregnancy. They are usually scheduled monthly at first, then more often in the last 2 months before delivery. Write down your questions. Take them to your prenatal visits. Keep all your prenatal visits as directed by  your health care provider. Safety Wear your seat belt at all times when driving. Make a list of emergency phone numbers, including numbers for family, friends, the hospital, and police and fire departments. General Tips Ask your health care provider for a referral to a local prenatal education class. Begin classes no later than at the beginning of month 6 of your pregnancy. Ask for help if you have counseling or nutritional needs during pregnancy. Your health care provider can offer advice or refer you to specialists for help with various needs. Do not use hot tubs, steam rooms, or saunas. Do not douche or use tampons or scented sanitary pads. Do not cross your legs for long periods of time. Avoid cat litter boxes and soil used by cats. These carry germs that can cause birth defects in the baby and possibly loss of the fetus by miscarriage or stillbirth. Avoid all smoking, herbs, alcohol, and medicines not prescribed by your health care provider. Chemicals in these affect the formation and growth of the baby. Schedule a dentist appointment. At home, brush your teeth with a soft toothbrush and be gentle when you floss. SEEK MEDICAL CARE IF:  You have dizziness. You have mild pelvic cramps, pelvic pressure, or nagging pain in the abdominal area. You have persistent nausea, vomiting, or diarrhea. You have a bad smelling vaginal  discharge. You have pain with urination. You notice increased swelling in your face, hands, legs, or ankles. SEEK IMMEDIATE MEDICAL CARE IF:  You have a fever. You are leaking fluid from your vagina. You have spotting or bleeding from your vagina. You have severe abdominal cramping or pain. You have rapid weight gain or loss. You vomit blood or material that looks like coffee grounds. You are exposed to [place] measles and have never had them. You are exposed to fifth disease or chickenpox. You develop a severe headache. You have shortness of breath. You have any kind of trauma, such as from a fall or a car accident. Document Released: 06/08/2001 Document Revised: 10/29/2013 Document Reviewed: 04/24/2013 [facility] Patient Information 2015 [place], [place]. This information is not intended to replace advice given to you by your health care provider. Make sure you discuss any questions you have with your health care provider.  Coronavirus (COVID-19) Are you at risk?  Are you at risk for the Coronavirus (COVID-19)?  To be considered HIGH RISK for Coronavirus (COVID-19), you have to meet the following criteria:  Traveled to [place], [place], [place], [place] or [place];  and have fever, cough, and shortness of breath within the last 2 weeks of travel OR Been in close contact with a person diagnosed with COVID-19 within the last 2 weeks and have fever, cough, and shortness of breath IF YOU DO NOT MEET THESE CRITERIA, YOU ARE CONSIDERED LOW RISK FOR COVID-19.  What to do if you are HIGH RISK for COVID-19?  If you are having a medical emergency, call 911. Seek medical care right away. Before you go to a doctors office, urgent care or emergency department, call ahead and tell them about your recent travel, contact with someone diagnosed with COVID-19, and your symptoms. You should receive instructions from your physicians office regarding next steps of care.  When you arrive at healthcare provider,  tell the healthcare staff immediately you have returned from visiting [place], [place], [place], [place] or [place]; in the last two weeks or you have been in close contact with a person diagnosed with COVID-19 in the last 2 weeks.  Tell the health care staff about your symptoms: fever, cough and shortness of breath. After you have been seen by a medical provider, you will be either: Tested for (COVID-19) and discharged home on quarantine except to seek medical care if symptoms worsen, and asked to  Stay home and avoid contact with others until you get your results (4-5 days)  Avoid travel on public transportation if possible (such as bus, train, or airplane) or Sent to the Emergency Department by EMS for evaluation, COVID-19 testing, and possible admission depending on your condition and test results.  What to do if you are LOW RISK for COVID-19?  Reduce your risk of any infection by using the same precautions used for avoiding the common cold or flu:  Wash your hands often with soap and warm water for at least 20 seconds.  If soap and water are not readily available, use an alcohol-based hand sanitizer with at least 60% alcohol.  If coughing or sneezing, cover your mouth and nose by coughing or sneezing into the elbow areas of your shirt or coat, into a tissue or into your sleeve (not your hands). Avoid shaking hands with others and consider head nods or verbal greetings only. Avoid touching your eyes, nose, or mouth with unwashed hands.  Avoid close contact with people who are sick. Avoid places or events with large numbers of people in one location, like concerts or sporting events. Carefully consider travel plans you have or are making. If you are planning any travel outside or inside the [place], visit the [facility] webpage for the latest health notices. If you have some symptoms but not all symptoms, continue to monitor at home and seek medical attention if your symptoms worsen. If  you are having a medical emergency, call 911.   ADDITIONAL HEALTHCARE OPTIONS FOR PATIENTS  [facility] / e-Visit: [email or web address]         [facility]: [phone]  [facility]: [phone]                   [facility]: [phone]     Safe Medications in Pregnancy   Acne: Benzoyl Peroxide Salicylic Acid  Backache/Headache: Tylenol: 2 regular strength every 4 hours OR              2 Extra strength every 6 hours  Colds/Coughs/Allergies: Benadryl (alcohol free) 25 mg every 6 hours as needed Breath right strips Claritin Cepacol throat lozenges Chloraseptic throat spray Cold-Eeze- up to three times per day Cough drops, alcohol free Flonase (by prescription only) Guaifenesin Mucinex Robitussin DM (plain only, alcohol free) Saline nasal spray/drops Sudafed (pseudoephedrine) & Actifed ** use only after [redacted] weeks gestation and if you do not have high blood pressure Tylenol Vicks Vaporub Zinc lozenges Zyrtec   Constipation: Colace Ducolax suppositories Fleet enema Glycerin suppositories Metamucil Milk of magnesia Miralax Senokot Smooth move tea  Diarrhea: Kaopectate Imodium A-D  *NO pepto Bismol  Hemorrhoids: Anusol Anusol HC Preparation H Tucks  Indigestion: Tums Maalox Mylanta Zantac  Pepcid  Insomnia: Benadryl (alcohol free) 25mg  every 6 hours as needed Tylenol PM Unisom, no Gelcaps  Leg Cramps: Tums MagGel  Nausea/Vomiting:  Bonine Dramamine Emetrol Ginger extract Sea bands Meclizine  Nausea medication to take during pregnancy:  Unisom (doxylamine succinate 25 mg tablets) Take one tablet daily at bedtime. If symptoms are not adequately controlled, the dose can be increased to a maximum recommended dose of two tablets daily (1/2 tablet in  the morning, 1/2 tablet mid-afternoon and one at bedtime). Vitamin B6 100mg  tablets. Take one  tablet twice a day (up to 200 mg per day).  Skin Rashes: Aveeno products Benadryl cream or 25mg  every 6 hours as needed Calamine Lotion 1% cortisone cream  Yeast infection: Gyne-lotrimin 7 Monistat 7   **If taking multiple medications, please check labels to avoid duplicating the same active ingredients **take medication as directed on the label ** Do not exceed 4000 mg of tylenol in 24 hours **Do not take medications that contain aspirin or ibuprofen

## 2021-07-09 NOTE — Progress Notes (Signed)
INITIAL OBSTETRICAL VISIT Patient name: Stacy Richardson MRN [id number]  Date of birth: 06/24/2001 Chief Complaint:   Initial Prenatal Visit (Wants prenatal vitamins)  History of Present Illness:   Stacy Richardson is a 21 y.o. G38P1001  female at [redacted]w[redacted]d by LMP c/w u/s at 8 weeks with an Estimated Date of Delivery: 01/16/22 being seen today for her initial obstetrical visit.   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   Today she reports no complaints.  Depression screen [facility] 2/9 07/09/2021 04/01/2021 09/10/2019  Decreased Interest 0 0 0  Down, Depressed, Hopeless 0 0 0  PHQ - 2 Score 0 0 0  Altered sleeping 1 0 -  Tired, decreased energy 1 0 -  Change in appetite 1 1 -  Feeling bad or failure about yourself  0 0 -  Trouble concentrating 0 0 -  Moving slowly or fidgety/restless 0 0 -  Suicidal thoughts - 0 -  PHQ-9 Score 3 1 -    Patient's last menstrual period was 04/11/2021 (exact date). Last pap 04-01-21. Results were: normal Review of Systems:   Pertinent items are noted in HPI Denies cramping/contractions, leakage of fluid, vaginal bleeding, abnormal vaginal discharge w/ itching/odor/irritation, headaches, visual changes, shortness of breath, chest pain, abdominal pain, severe nausea/vomiting, or problems with urination or bowel movements unless otherwise stated above.  Pertinent History Reviewed:  Reviewed past medical,surgical, social, obstetrical and family history.  Reviewed problem list, medications and allergies. OB History  Gravida Para Term Preterm AB Living  2 1 1     1   SAB IAB Ectopic Multiple Live Births        0 1    # Outcome Date GA Lbr Len/2nd Weight Sex Delivery Anes PTL Lv  2 Current           1 Term 01/27/18 [redacted]w[redacted]d 17:09 / 00:47 7 lb 8.1 oz (3.405 kg) M Vag-Vacuum None N LIV     Birth Comments: WNL   Physical Assessment:   Vitals:   07/09/21 1122  BP: 117/64  Pulse: 91  Weight: 120 lb (54.4 kg)  Body mass index is  20.6 kg/m.       Physical Examination:  General appearance - well appearing, and in no distress  Mental status - alert, oriented to person, place, and time  Psych:  She has a normal mood and affect  Skin - warm and dry, normal color, no suspicious lesions noted  Chest - effort normal  Heart - normal rate and regular rhythm  Abdomen - soft, nontender  Extremities:  No swelling or varicosities noted   Results for orders placed or performed in visit on 07/09/21 (from the past 24 hour(s))  POC Urinalysis Dipstick OB   Collection Time: 07/09/21 11:53 AM  Result Value Ref Range   Color, UA     Clarity, UA     Glucose, UA Negative Negative   Bilirubin, UA     Ketones, UA neg    Spec Grav, UA     Blood, UA neg    pH, UA     POC,PROTEIN,UA Negative Negative, Trace, Small (1+), Moderate (2+), Large (3+), 4+   Urobilinogen, UA     Nitrite, UA neg    Leukocytes, UA Negative Negative   Appearance     Odor      Assessment & Plan:  1) Low-Risk Pregnancy G2P1001 at [redacted]w[redacted]d with an Estimated Date of Delivery: 01/16/22   2)  Initial OB visit   Meds:  Meds ordered this encounter  Medications   Prenat-FeCbn-FeAspGl-FA-Omega (OB COMPLETE PETITE) 35-5-1-200 MG CAPS    Sig: Take 1 tablet by mouth daily. BIN:  [id number]   PCN:  [id number]     GRP:  [id number]    ID:  [id number]    Dispense:  30 capsule    Refill:  11    Order Specific Question:   Supervising Provider    Answer:   Lazaro Arms [[id number]]    Initial labs--will draw Monday w/NT/IT Continue prenatal vitamins Reviewed n/v relief measures and warning s/s to report Reviewed recommended weight gain based on pre-gravid BMI Encouraged well-balanced diet Genetic & carrier screening discussed: requests Panorama, NT/IT, and Horizon , declines AFP Ultrasound discussed; fetal survey: ordered [facility] completed> form faxed if has or is planning to apply for medicaid The nature of [employer] for [employer] with multiple MDs and other  Advanced Practice Providers was explained to patient; also emphasized that fellows, residents, and students are part of our team. Has home bp cuff. Check bp weekly, let us know if >140/90.        Stacy Richardson 12:23 PM

## 2021-07-10 ENCOUNTER — Other Ambulatory Visit: Payer: Self-pay | Admitting: Obstetrics & Gynecology

## 2021-07-10 DIAGNOSIS — Z3682 Encounter for antenatal screening for nuchal translucency: Secondary | ICD-10-CM

## 2021-07-10 LAB — GC/CHLAMYDIA PROBE AMP
Chlamydia trachomatis, NAA: NEGATIVE
Neisseria Gonorrhoeae by PCR: NEGATIVE

## 2021-07-11 LAB — URINE CULTURE: Organism ID, Bacteria: NO GROWTH

## 2021-07-13 ENCOUNTER — Ambulatory Visit (INDEPENDENT_AMBULATORY_CARE_PROVIDER_SITE_OTHER): Payer: Medicaid Other

## 2021-07-13 ENCOUNTER — Other Ambulatory Visit: Payer: Medicaid Other

## 2021-07-13 ENCOUNTER — Other Ambulatory Visit: Payer: Self-pay

## 2021-07-13 DIAGNOSIS — Z1379 Encounter for other screening for genetic and chromosomal anomalies: Secondary | ICD-10-CM

## 2021-07-13 DIAGNOSIS — Z3A13 13 weeks gestation of pregnancy: Secondary | ICD-10-CM | POA: Diagnosis not present

## 2021-07-13 DIAGNOSIS — Z3480 Encounter for supervision of other normal pregnancy, unspecified trimester: Secondary | ICD-10-CM

## 2021-07-13 DIAGNOSIS — Z3682 Encounter for antenatal screening for nuchal translucency: Secondary | ICD-10-CM

## 2021-07-13 NOTE — Progress Notes (Signed)
[place] 13+2 wks,measurements c/w dates,CRL 81.47 mm,NB present,NT 1.5 mm,posterior placenta,normal ovaries,FHR 147 bpm

## 2021-07-14 LAB — CBC/D/PLT+RPR+RH+ABO+RUBIGG...
Antibody Screen: NEGATIVE
Basophils Absolute: 0 10*3/uL (ref 0.0–0.2)
Basos: 0 %
EOS (ABSOLUTE): 0.1 10*3/uL (ref 0.0–0.4)
Eos: 2 %
HCV Ab: <0.1 s/co ratio (ref 0.0–0.9)
HIV Screen 4th Generation wRfx: NONREACTIVE
Hematocrit: 34 % (ref 34.0–46.6)
Hemoglobin: 11.3 g/dL (ref 11.1–15.9)
Hepatitis B Surface Ag: NEGATIVE
Immature Grans (Abs): 0 10*3/uL (ref 0.0–0.1)
Immature Granulocytes: 0 %
Lymphocytes Absolute: 1.4 10*3/uL (ref 0.7–3.1)
Lymphs: 19 %
MCH: 28.2 pg (ref 26.6–33.0)
MCHC: 33.2 g/dL (ref 31.5–35.7)
MCV: 85 fL (ref 79–97)
Monocytes Absolute: 0.6 10*3/uL (ref 0.1–0.9)
Monocytes: 8 %
Neutrophils Absolute: 5.4 10*3/uL (ref 1.4–7.0)
Neutrophils: 71 %
Platelets: 290 10*3/uL (ref 150–450)
RBC: 4.01 x10E6/uL (ref 3.77–5.28)
RDW: 13 % (ref 11.7–15.4)
RPR Ser Ql: NONREACTIVE
Rh Factor: POSITIVE
Rubella Antibodies, IGG: 2.22 index (ref >0.99)
WBC: 7.6 10*3/uL (ref 3.4–10.8)

## 2021-07-14 LAB — HCV INTERPRETATION

## 2021-07-24 ENCOUNTER — Encounter: Payer: Self-pay | Admitting: *Deleted

## 2021-07-24 ENCOUNTER — Telehealth: Payer: Self-pay | Admitting: Women's Health

## 2021-07-24 NOTE — Telephone Encounter (Signed)
Patient called stating that she was told her results for the natera should be in, in 14 days but she does not see the results. Pt states she has been in the [employer] site and her kit does not show anymore. Please contact pt

## 2021-07-28 ENCOUNTER — Encounter: Payer: Self-pay | Admitting: Women's Health

## 2021-07-28 DIAGNOSIS — O285 Abnormal chromosomal and genetic finding on antenatal screening of mother: Secondary | ICD-10-CM | POA: Insufficient documentation

## 2021-08-04 ENCOUNTER — Encounter: Payer: Self-pay | Admitting: *Deleted

## 2021-08-04 ENCOUNTER — Encounter: Payer: Self-pay | Admitting: Advanced Practice Midwife

## 2021-08-06 ENCOUNTER — Encounter: Payer: Self-pay | Admitting: Advanced Practice Midwife

## 2021-08-06 ENCOUNTER — Ambulatory Visit (INDEPENDENT_AMBULATORY_CARE_PROVIDER_SITE_OTHER): Payer: Medicaid Other | Admitting: Advanced Practice Midwife

## 2021-08-06 ENCOUNTER — Other Ambulatory Visit: Payer: Self-pay

## 2021-08-06 VITALS — BP 106/69 | HR 97 | Wt 125.0 lb

## 2021-08-06 DIAGNOSIS — Z3480 Encounter for supervision of other normal pregnancy, unspecified trimester: Secondary | ICD-10-CM

## 2021-08-06 DIAGNOSIS — Z363 Encounter for antenatal screening for malformations: Secondary | ICD-10-CM

## 2021-08-06 NOTE — Patient Instructions (Signed)
Stacy Richardson, I greatly value your feedback.  If you receive a survey following your visit with [place] today, we appreciate you taking the time to fill it out.  Thanks, Cathie Beams, CNM     [facility] HAS MOVED!!! It is now [facility] ([address] [place], [place] [address]) Entrance located off of [address] Free 24/7 valet parking   Go to [email or web address] to register for FREE online childbirth classes    Second Trimester of Pregnancy The second trimester is from week 14 through week 27 (months 4 through 6). The second trimester is often a time when you feel your best. Your body has adjusted to being pregnant, and you begin to feel better physically. Usually, morning sickness has lessened or quit completely, you may have more energy, and you may have an increase in appetite. The second trimester is also a time when the fetus is growing rapidly. At the end of the sixth month, the fetus is about 9 inches long and weighs about 1 pounds. You will likely begin to feel the baby move (quickening) between 16 and 20 weeks of pregnancy. Body changes during your second trimester Your body continues to go through many changes during your second trimester. The changes vary from woman to woman. Your weight will continue to increase. You will notice your lower abdomen bulging out. You may begin to get stretch marks on your hips, abdomen, and breasts. You may develop headaches that can be relieved by medicines. The medicines should be approved by your health care provider. You may urinate more often because the fetus is pressing on your bladder. You may develop or continue to have heartburn as a result of your pregnancy. You may develop constipation because certain hormones are causing the muscles that push waste through your intestines to slow down. You may develop hemorrhoids or swollen, bulging veins (varicose veins). You may have back pain. This is  caused by: Weight gain. Pregnancy hormones that are relaxing the joints in your pelvis. A shift in weight and the muscles that support your balance. Your breasts will continue to grow and they will continue to become tender. Your gums may bleed and may be sensitive to brushing and flossing. Dark spots or blotches (chloasma, mask of pregnancy) may develop on your face. This will likely fade after the baby is born. A dark line from your belly button to the pubic area (linea nigra) may appear. This will likely fade after the baby is born. You may have changes in your hair. These can include thickening of your hair, rapid growth, and changes in texture. Some women also have hair loss during or after pregnancy, or hair that feels dry or thin. Your hair will most likely return to normal after your baby is born.  What to expect at prenatal visits During a routine prenatal visit: You will be weighed to make sure you and the fetus are growing normally. Your blood pressure will be taken. Your abdomen will be measured to track your baby's growth. The fetal heartbeat will be listened to. Any test results from the previous visit will be discussed.  Your health care provider may ask you: How you are feeling. If you are feeling the baby move. If you have had any abnormal symptoms, such as leaking fluid, bleeding, severe headaches, or abdominal cramping. If you are using any tobacco products, including cigarettes, chewing tobacco, and electronic cigarettes. If you have any questions.  Other tests that  may be performed during your second trimester include: Blood tests that check for: Low iron levels (anemia). High blood sugar that affects pregnant women (gestational diabetes) between 72 and 28 weeks. Rh antibodies. This is to check for a protein on red blood cells (Rh factor). Urine tests to check for infections, diabetes, or protein in the urine. An ultrasound to confirm the proper growth and  development of the baby. An amniocentesis to check for possible genetic problems. Fetal screens for spina bifida and Down syndrome. HIV (human immunodeficiency virus) testing. Routine prenatal testing includes screening for HIV, unless you choose not to have this test.  Follow these instructions at home: Medicines Follow your health care provider's instructions regarding medicine use. Specific medicines may be either safe or unsafe to take during pregnancy. Take a prenatal vitamin that contains at least 600 micrograms (mcg) of folic acid. If you develop constipation, try taking a stool softener if your health care provider approves. Eating and drinking Eat a balanced diet that includes fresh fruits and vegetables, whole grains, good sources of protein such as meat, eggs, or tofu, and low-fat dairy. Your health care provider will help you determine the amount of weight gain that is right for you. Avoid raw meat and uncooked cheese. These carry germs that can cause birth defects in the baby. If you have low calcium intake from food, talk to your health care provider about whether you should take a daily calcium supplement. Limit foods that are high in fat and processed sugars, such as fried and sweet foods. To prevent constipation: Drink enough fluid to keep your urine clear or pale yellow. Eat foods that are high in fiber, such as fresh fruits and vegetables, whole grains, and beans. Activity Exercise only as directed by your health care provider. Most women can continue their usual exercise routine during pregnancy. Try to exercise for 30 minutes at least 5 days a week. Stop exercising if you experience uterine contractions. Avoid heavy lifting, wear low heel shoes, and practice good posture. A sexual relationship may be continued unless your health care provider directs you otherwise. Relieving pain and discomfort Wear a good support bra to prevent discomfort from breast tenderness. Take  warm sitz baths to soothe any pain or discomfort caused by hemorrhoids. Use hemorrhoid cream if your health care provider approves. Rest with your legs elevated if you have leg cramps or low back pain. If you develop varicose veins, wear support hose. Elevate your feet for 15 minutes, 3-4 times a day. Limit salt in your diet. Prenatal Care Write down your questions. Take them to your prenatal visits. Keep all your prenatal visits as told by your health care provider. This is important. Safety Wear your seat belt at all times when driving. Make a list of emergency phone numbers, including numbers for family, friends, the hospital, and police and fire departments. General instructions Ask your health care provider for a referral to a local prenatal education class. Begin classes no later than the beginning of month 6 of your pregnancy. Ask for help if you have counseling or nutritional needs during pregnancy. Your health care provider can offer advice or refer you to specialists for help with various needs. Do not use hot tubs, steam rooms, or saunas. Do not douche or use tampons or scented sanitary pads. Do not cross your legs for long periods of time. Avoid cat litter boxes and soil used by cats. These carry germs that can cause birth defects in the baby and  possibly loss of the fetus by miscarriage or stillbirth. Avoid all smoking, herbs, alcohol, and unprescribed drugs. Chemicals in these products can affect the formation and growth of the baby. Do not use any products that contain nicotine or tobacco, such as cigarettes and e-cigarettes. If you need help quitting, ask your health care provider. Visit your dentist if you have not gone yet during your pregnancy. Use a soft toothbrush to brush your teeth and be gentle when you floss. Contact a health care provider if: You have dizziness. You have mild pelvic cramps, pelvic pressure, or nagging pain in the abdominal area. You have persistent  nausea, vomiting, or diarrhea. You have a bad smelling vaginal discharge. You have pain when you urinate. Get help right away if: You have a fever. You are leaking fluid from your vagina. You have spotting or bleeding from your vagina. You have severe abdominal cramping or pain. You have rapid weight gain or weight loss. You have shortness of breath with chest pain. You notice sudden or extreme swelling of your face, hands, ankles, feet, or legs. You have not felt your baby move in over an hour. You have severe headaches that do not go away when you take medicine. You have vision changes. Summary The second trimester is from week 14 through week 27 (months 4 through 6). It is also a time when the fetus is growing rapidly. Your body goes through many changes during pregnancy. The changes vary from woman to woman. Avoid all smoking, herbs, alcohol, and unprescribed drugs. These chemicals affect the formation and growth your baby. Do not use any tobacco products, such as cigarettes, chewing tobacco, and e-cigarettes. If you need help quitting, ask your health care provider. Contact your health care provider if you have any questions. Keep all prenatal visits as told by your health care provider. This is important. This information is not intended to replace advice given to you by your health care provider. Make sure you discuss any questions you have with your health care provider.

## 2021-08-06 NOTE — Progress Notes (Signed)
° °  LOW-RISK PREGNANCY VISIT Patient name: Stacy Richardson MRN [id number]  Date of birth: [birth year] Chief Complaint:   Routine Prenatal Visit (? Yeast on left breast)  History of Present Illness:   Stacy Richardson is a 21 y.o. G3P1001 female at [redacted]w[redacted]d with an Estimated Date of Delivery: 01/16/22 being seen today for ongoing management of a low-risk pregnancy.  Today she reports no complaints. Contractions: Not present. Vag. Bleeding: None.  Movement: Present. denies leaking of fluid.   Review of Systems:   Pertinent items are noted in HPI Denies abnormal vaginal discharge w/ itching/odor/irritation, headaches, visual changes, shortness of breath, chest pain, abdominal pain, severe nausea/vomiting, or problems with urination or bowel movements unless otherwise stated above. Pertinent History Reviewed:  Reviewed past medical,surgical, social, obstetrical and family history.  Reviewed problem list, medications and allergies. Physical Assessment:   Vitals:   08/06/21 1052  BP: 106/69  Pulse: 97  Weight: 125 lb (56.7 kg)  Body mass index is 21.46 kg/m.        Physical Examination:   General appearance: Well appearing, and in no distress  Mental status: Alert, oriented to person, place, and time  Skin: Warm & dry  Cardiovascular: Normal heart rate noted  Respiratory: Normal respiratory effort, no distress  Abdomen: Soft, gravid, nontender  Pelvic: Cervical exam deferred         Extremities: Edema: None  Fetal Status:     Movement: Present    Chaperone: n/a    No results found for this or any previous visit (from the past 24 hour(s)).  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  Labs/procedures today: none.  IT #1 wasn't run (?), will get AFP next visit  Plan:   Continue routine obstetrical care  Next visit: prefers in person    Reviewed: Preterm labor symptoms and general obstetric precautions including but not limited to vaginal bleeding, contractions, leaking of fluid and fetal movement were reviewed in detail with the patient.  All questions were answered. Has home bp cuff.. Check bp weekly, let us know if >140/90.   Follow-up: Return in about 2 weeks (around 08/20/2021) for LROB, [id number].  Orders Placed This Encounter  Procedures   US OB Comp + 14 Wk   Christin Fudge DNP, CNM 08/06/2021 11:08 AM

## 2021-08-26 ENCOUNTER — Ambulatory Visit (INDEPENDENT_AMBULATORY_CARE_PROVIDER_SITE_OTHER): Payer: Medicaid Other | Admitting: Advanced Practice Midwife

## 2021-08-26 ENCOUNTER — Other Ambulatory Visit: Payer: Self-pay

## 2021-08-26 ENCOUNTER — Ambulatory Visit (INDEPENDENT_AMBULATORY_CARE_PROVIDER_SITE_OTHER): Payer: Medicaid Other

## 2021-08-26 VITALS — BP 108/65 | HR 78 | Wt 129.0 lb

## 2021-08-26 DIAGNOSIS — Z3A19 19 weeks gestation of pregnancy: Secondary | ICD-10-CM

## 2021-08-26 DIAGNOSIS — Z363 Encounter for antenatal screening for malformations: Secondary | ICD-10-CM

## 2021-08-26 DIAGNOSIS — O285 Abnormal chromosomal and genetic finding on antenatal screening of mother: Secondary | ICD-10-CM

## 2021-08-26 DIAGNOSIS — Z3480 Encounter for supervision of other normal pregnancy, unspecified trimester: Secondary | ICD-10-CM

## 2021-08-26 DIAGNOSIS — Z1379 Encounter for other screening for genetic and chromosomal anomalies: Secondary | ICD-10-CM

## 2021-08-26 NOTE — Progress Notes (Signed)
? ?  LOW-RISK PREGNANCY VISIT ?Patient name: Stacy Richardson MRN [id number]  Date of birth: 04-15-2001 ?Chief Complaint:   ?Routine Prenatal Visit and Pregnancy Ultrasound ? ?History of Present Illness:   ?Stacy Richardson is a 21 y.o. G58P1001 female at [redacted]w[redacted]d with an Estimated Date of Delivery: 01/16/22 being seen today for ongoing management of a low-risk pregnancy.  ?Today she reports  (due to our appt mix-up, she was unable to stay for her visit portion due to childcare  . Contractions: Not present. Vag. Bleeding: None.  Movement: Present. denies leaking of fluid. ?Review of Systems:   ?Pertinent items are noted in HPI ?Denies abnormal vaginal discharge w/ itching/odor/irritation, headaches, visual changes, shortness of breath, chest pain, abdominal pain, severe nausea/vomiting, or problems with urination or bowel movements unless otherwise stated above. ?Pertinent History Reviewed:  ?Reviewed past medical,surgical, social, obstetrical and family history.  ?Reviewed problem list, medications and allergies. ?Physical Assessment:  ? ?Vitals:  ? 08/26/21 1619  ?BP: 108/65  ?Pulse: 78  ?Weight: 129 lb (58.5 kg)  ?Body mass index is 22.14 kg/m?. ?  ?     Physical Examination:  ? General appearance: Well appearing, and in no distress ? Mental status: Alert, oriented to person, place, and time ? Skin: Warm & dry ? Cardiovascular: Normal heart rate noted ? Respiratory: Normal respiratory effort, no distress ? Abdomen: Soft, gravid, nontender ? Pelvic: Cervical exam deferred        ? Extremities: Edema: None ? ?Fetal Status: Fetal Heart Rate (bpm): 146 u/s   Movement: Present   ? ?Anatomy u/s: [place] 19+4 wks,cephalic,cx 4.2 cm,posterior placenta gr 0,SVP of fluid,FHR 146 bpm,EFW 341 g 81%,anatomy complete,no obvious abnormalities ? ?No results found for this or any previous visit (from the past 24 hour(s)).  ?Assessment & Plan:  ?1) Low-risk pregnancy G2P1001 at [redacted]w[redacted]d with an Estimated Date of Delivery: 01/16/22  ? ?  ?Meds: No orders  of the defined types were placed in this encounter. ? ?Labs/procedures today: anatomy u/s; AFP (order given; says she can do it tomorrow) ? ?Plan:  Continue routine obstetrical care (with f/u provider on Monday due to our scheduling issue today) ? ? ?Follow-up: Return in about 4 weeks (around 09/23/2021) for LROB, in person. ? ?Orders Placed This Encounter  ?Procedures  ? AFP, Serum, Open Spina Bifida  ? ?Arabella Merles CNM ?08/26/2021 ?4:33 PM  ?

## 2021-08-26 NOTE — Progress Notes (Addendum)
[place] 19+4 wks,cephalic,cx 4.2 cm,posterior placenta gr 0,SVP of fluid 5.7 cm,FHR 146 bpm,EFW 341 g 81%,anatomy complete,no obvious abnormalities  ?

## 2021-08-26 NOTE — Patient Instructions (Signed)
Stacy Richardson, thank you for choosing our office today! We appreciate the opportunity to meet your healthcare needs. You may receive a short survey by mail, e-mail, or through [employer]. If you are happy with your care we would appreciate if you could take just a few minutes to complete the survey questions. We read all of your comments and take your feedback very seriously. Thank you again for choosing our office.  Center for [employer] Team at [facility] & [facility] at [facility] ([address] [place], [place] [address]) Entrance C, located off of [address] Free 24/7 valet parking  Go to [email or web address] to register for FREE online childbirth classes  Call the office [phone]) or go to [facility] if: You begin to severe cramping Your water breaks.  Sometimes it is a big gush of fluid, sometimes it is just a trickle that keeps getting your panties wet or running down your legs You have vaginal bleeding.  It is normal to have a small amount of spotting if your cervix was checked.   [facility] Pediatricians/Family Doctors [facility]): [address] Colette Ribas, [phone]           [facility]: [address], [phone]                [facility]): [address], [phone] (call to ask if accepting patients) [facility] Department: [address], [place], [phone]    [facility] Pediatricians/Family Doctors [facility]): [phone], [phone] [facility]: [address] [place], [phone] [facility]: [address], [phone]  [facility] Doctors  [facility]): [phone] [facility]: [address], [phone]   [facility] Doctors [facility]: [address], [phone]  [facility] Doctors  [facility]: [phone], [phone]  Home Blood Pressure Monitoring for Patients   Your provider has recommended that you check your blood pressure (BP) at least once a week at home. If you do not have a blood pressure cuff at home, one will be provided for you. Contact your provider if you have not received your monitor within 1 week.   Helpful Tips for Accurate Home Blood Pressure Checks  Don't smoke, exercise, or drink caffeine 30 minutes before checking your BP Use the restroom before checking your BP (a full bladder can raise your pressure) Relax in a comfortable upright chair Feet on the ground Left arm resting comfortably on a flat surface at the level of your heart Legs uncrossed Back supported Sit quietly and don't talk Place the cuff on your bare arm Adjust snuggly, so that only two fingertips can fit between your skin and the top of the cuff Check 2 readings separated by at least one minute Keep a log of your BP readings For a visual, please reference this diagram: http://ccnc.care/bpdiagram  Provider Name: [facility]     Phone: [phone]  Zone 1: ALL CLEAR  Continue to monitor your symptoms:  BP reading is less than 140 (top number) or less than 90 (bottom number)  No right upper stomach pain No headaches or seeing spots No feeling nauseated or throwing up No swelling in face and hands  Zone 2: CAUTION Call your doctor's office for any of the following:  BP reading is greater than 140 (top number) or greater than  90 (bottom number)  Stomach pain under your ribs in the middle or right side Headaches or seeing spots Feeling nauseated or throwing up Swelling in face and hands  Zone 3: EMERGENCY  Seek immediate medical care if you have any of the following:  BP reading is greater than160 (top number) or greater than 110 (bottom number) Severe headaches not improving with Tylenol Serious difficulty catching your breath Any worsening symptoms from  Zone 2     Second Trimester of Pregnancy The second trimester is from week 14 through week 27 (months 4 through 6). The second trimester is often a time when you feel your best. Your body has adjusted to being pregnant, and you begin to feel better physically. Usually, morning sickness has lessened or quit completely, you may have more energy, and you may have an increase in appetite. The second trimester is also a time when the fetus is growing rapidly. At the end of the sixth month, the fetus is about 9 inches long and weighs about 1 pounds. You will likely begin to feel the baby move (quickening) between 16 and 20 weeks of pregnancy. Body changes during your second trimester Your body continues to go through many changes during your second trimester. The changes vary from woman to woman. Your weight will continue to increase. You will notice your lower abdomen bulging out. You may begin to get stretch marks on your hips, abdomen, and breasts. You may develop headaches that can be relieved by medicines. The medicines should be approved by your health care provider. You may urinate more often because the fetus is pressing on your bladder. You may develop or continue to have heartburn as a result of your pregnancy. You may develop constipation because certain hormones are causing the muscles that push waste through your intestines to slow down. You may develop hemorrhoids or swollen, bulging veins (varicose veins). You may have back pain. This is caused by: Weight gain. Pregnancy hormones that are relaxing the joints in your pelvis. A shift in weight and the muscles that support your balance. Your breasts will continue to grow and they will continue to become tender. Your gums may bleed and may be sensitive to brushing and flossing. Dark spots or blotches (chloasma, mask of pregnancy) may develop on your face. This will likely fade after the baby is born. A dark line from your belly button to  the pubic area (linea nigra) may appear. This will likely fade after the baby is born. You may have changes in your hair. These can include thickening of your hair, rapid growth, and changes in texture. Some women also have hair loss during or after pregnancy, or hair that feels dry or thin. Your hair will most likely return to normal after your baby is born.  What to expect at prenatal visits During a routine prenatal visit: You will be weighed to make sure you and the fetus are growing normally. Your blood pressure will be taken. Your abdomen will be measured to track your baby's growth. The fetal heartbeat will be listened to. Any test results from the previous visit will be discussed.  Your health care provider may ask you: How you are feeling. If you are feeling the baby move. If you have had any abnormal symptoms, such as leaking fluid, bleeding, severe headaches, or abdominal cramping. If you are using any tobacco products, including cigarettes, chewing tobacco, and electronic cigarettes. If you have any questions.  Other tests that may be performed during   your second trimester include: Blood tests that check for: Low iron levels (anemia). High blood sugar that affects pregnant women (gestational diabetes) between 24 and 28 weeks. Rh antibodies. This is to check for a protein on red blood cells (Rh factor). Urine tests to check for infections, diabetes, or protein in the urine. An ultrasound to confirm the proper growth and development of the baby. An amniocentesis to check for possible genetic problems. Fetal screens for spina bifida and Down syndrome. HIV (human immunodeficiency virus) testing. Routine prenatal testing includes screening for HIV, unless you choose not to have this test.  Follow these instructions at home: Medicines Follow your health care provider's instructions regarding medicine use. Specific medicines may be either safe or unsafe to take during  pregnancy. Take a prenatal vitamin that contains at least 600 micrograms (mcg) of folic acid. If you develop constipation, try taking a stool softener if your health care provider approves. Eating and drinking Eat a balanced diet that includes fresh fruits and vegetables, whole grains, good sources of protein such as meat, eggs, or tofu, and low-fat dairy. Your health care provider will help you determine the amount of weight gain that is right for you. Avoid raw meat and uncooked cheese. These carry germs that can cause birth defects in the baby. If you have low calcium intake from food, talk to your health care provider about whether you should take a daily calcium supplement. Limit foods that are high in fat and processed sugars, such as fried and sweet foods. To prevent constipation: Drink enough fluid to keep your urine clear or pale yellow. Eat foods that are high in fiber, such as fresh fruits and vegetables, whole grains, and beans. Activity Exercise only as directed by your health care provider. Most women can continue their usual exercise routine during pregnancy. Try to exercise for 30 minutes at least 5 days a week. Stop exercising if you experience uterine contractions. Avoid heavy lifting, wear low heel shoes, and practice good posture. A sexual relationship may be continued unless your health care provider directs you otherwise. Relieving pain and discomfort Wear a good support bra to prevent discomfort from breast tenderness. Take warm sitz baths to soothe any pain or discomfort caused by hemorrhoids. Use hemorrhoid cream if your health care provider approves. Rest with your legs elevated if you have leg cramps or low back pain. If you develop varicose veins, wear support hose. Elevate your feet for 15 minutes, 3-4 times a day. Limit salt in your diet. Prenatal Care Write down your questions. Take them to your prenatal visits. Keep all your prenatal visits as told by your health  care provider. This is important. Safety Wear your seat belt at all times when driving. Make a list of emergency phone numbers, including numbers for family, friends, the hospital, and police and fire departments. General instructions Ask your health care provider for a referral to a local prenatal education class. Begin classes no later than the beginning of month 6 of your pregnancy. Ask for help if you have counseling or nutritional needs during pregnancy. Your health care provider can offer advice or refer you to specialists for help with various needs. Do not use hot tubs, steam rooms, or saunas. Do not douche or use tampons or scented sanitary pads. Do not cross your legs for long periods of time. Avoid cat litter boxes and soil used by cats. These carry germs that can cause birth defects in the baby and possibly loss of the   fetus by miscarriage or stillbirth. Avoid all smoking, herbs, alcohol, and unprescribed drugs. Chemicals in these products can affect the formation and growth of the baby. Do not use any products that contain nicotine or tobacco, such as cigarettes and e-cigarettes. If you need help quitting, ask your health care provider. Visit your dentist if you have not gone yet during your pregnancy. Use a soft toothbrush to brush your teeth and be gentle when you floss. Contact a health care provider if: You have dizziness. You have mild pelvic cramps, pelvic pressure, or nagging pain in the abdominal area. You have persistent nausea, vomiting, or diarrhea. You have a bad smelling vaginal discharge. You have pain when you urinate. Get help right away if: You have a fever. You are leaking fluid from your vagina. You have spotting or bleeding from your vagina. You have severe abdominal cramping or pain. You have rapid weight gain or weight loss. You have shortness of breath with chest pain. You notice sudden or extreme swelling of your face, hands, ankles, feet, or legs. You  have not felt your baby move in over an hour. You have severe headaches that do not go away when you take medicine. You have vision changes. Summary The second trimester is from week 14 through week 27 (months 4 through 6). It is also a time when the fetus is growing rapidly. Your body goes through many changes during pregnancy. The changes vary from woman to woman. Avoid all smoking, herbs, alcohol, and unprescribed drugs. These chemicals affect the formation and growth your baby. Do not use any tobacco products, such as cigarettes, chewing tobacco, and e-cigarettes. If you need help quitting, ask your health care provider. Contact your health care provider if you have any questions. Keep all prenatal visits as told by your health care provider. This is important. This information is not intended to replace advice given to you by your health care provider. Make sure you discuss any questions you have with your health care provider. Document Released: 06/08/2001 Document Revised: 11/20/2015 Document Reviewed: 08/15/2012 Elsevier Interactive Patient Education  2017 [employer] Inc.  

## 2021-08-31 ENCOUNTER — Encounter: Payer: Medicaid Other | Admitting: Women's Health

## 2021-09-09 ENCOUNTER — Other Ambulatory Visit: Payer: Self-pay

## 2021-09-09 ENCOUNTER — Ambulatory Visit (INDEPENDENT_AMBULATORY_CARE_PROVIDER_SITE_OTHER): Payer: Medicaid Other | Admitting: Women's Health

## 2021-09-09 ENCOUNTER — Encounter: Payer: Self-pay | Admitting: Women's Health

## 2021-09-09 VITALS — BP 112/66 | HR 83 | Wt 133.0 lb

## 2021-09-09 DIAGNOSIS — Z3480 Encounter for supervision of other normal pregnancy, unspecified trimester: Secondary | ICD-10-CM

## 2021-09-09 DIAGNOSIS — Z3482 Encounter for supervision of other normal pregnancy, second trimester: Secondary | ICD-10-CM

## 2021-09-09 NOTE — Patient Instructions (Signed)
Stacy Richardson, thank you for choosing our office today! We appreciate the opportunity to meet your healthcare needs. You may receive a short survey by mail, e-mail, or through [employer]. If you are happy with your care we would appreciate if you could take just a few minutes to complete the survey questions. We read all of your comments and take your feedback very seriously. Thank you again for choosing our office.  ?Center for [employer] Team at [facility] ?Women's & Children's Center at [facility] ?([address] [place], [place] [address]) ?Entrance C, located off of [address] ?Free 24/7 valet parking  ?Go to [email or web address] to register for FREE online childbirth classes ? ?Call the office [phone]) or go to [facility] if: ?You begin to severe cramping ?Your water breaks.  Sometimes it is a big gush of fluid, sometimes it is just a trickle that keeps getting your panties wet or running down your legs ?You have vaginal bleeding.  It is normal to have a small amount of spotting if your cervix was checked.  ? ?[place] Pediatricians/Family Doctors ?[facility]): [address], [phone]           ?[facility]: [address], [phone]                ?[facility]): [address], [phone] (call to ask if accepting patients) ?[facility] Department: [address], [place], [phone]   ? ?[place] Pediatricians/Family Doctors ?[facility]): [address], [phone] ?[facility]: [address] [place], [phone] ?[facility]: [address], [phone] ? ?[employer] Family Doctors  ?[facility]): [phone] ?[facility]: [address], [phone]  ? ?[facility] Family Doctors ?[facility]: [address], [phone] ? ?[facility]  ?[facility]: [phone], [phone] ? ?Home Blood Pressure Monitoring for Patients  ? ?Your provider has recommended that you check your blood pressure (BP) at least once a week at home. If you do not have a blood pressure cuff at home, one will be provided for you. Contact your provider if you have not received your monitor within 1 week.  ? ?Helpful Tips for Accurate Home Blood Pressure Checks  ?Don't smoke, exercise, or drink caffeine 30 minutes before checking your BP ?Use the restroom before checking your BP (a full bladder can raise your pressure) ?Relax in a comfortable upright chair ?Feet on the ground ?Left arm resting comfortably on a flat surface at the level of your heart ?Legs uncrossed ?Back supported ?Sit quietly and don't talk ?Place the cuff on your bare arm ?Adjust snuggly, so that only two fingertips can fit between your skin and the top of the cuff ?Check 2 readings separated by at least one minute ?Keep a log of your BP readings ?For a visual, please reference this diagram: http://ccnc.care/bpdiagram ? ?Provider Name: [facility]     Phone: [phone] ? ?Zone 1: ALL CLEAR  ?Continue to monitor your symptoms:  ?BP reading is less than 140 (top number) or less than 90 (bottom number)  ?No right upper stomach pain ?No headaches or seeing spots ?No feeling nauseated or throwing up ?No swelling in face and hands ? ?Zone 2: CAUTION ?Call your doctor's office for any of the following:  ?BP reading is greater than 140 (top number) or greater than  90 (bottom number)  ?Stomach pain under your ribs in the middle or right side ?Headaches or seeing spots ?Feeling nauseated or throwing up ?Swelling in face and hands ? ?Zone 3: EMERGENCY  ?Seek immediate medical care if you have any of the following:  ?BP reading is greater than160 (top number) or greater than 110 (bottom number) ?Severe headaches not improving with Tylenol ?Serious difficulty catching your breath ?Any worsening symptoms from  Zone 2  ? ?  ?Second Trimester of Pregnancy ?The second trimester is from week 14 through week 27 (months 4 through 6). The second trimester is often a time when you feel your best. Your body has adjusted to being pregnant, and you begin to feel better physically. Usually, morning sickness has lessened or quit completely, you may have more energy, and you may have an increase in appetite. The second trimester is also a time when the fetus is growing rapidly. At the end of the sixth month, the fetus is about 9 inches long and weighs about 1? pounds. You will likely begin to feel the baby move (quickening) between 16 and 20 weeks of pregnancy. ?Body changes during your second trimester ?Your body continues to go through many changes during your second trimester. The changes vary from woman to woman. ?Your weight will continue to increase. You will notice your lower abdomen bulging out. ?You may begin to get stretch marks on your hips, abdomen, and breasts. ?You may develop headaches that can be relieved by medicines. The medicines should be approved by your health care provider. ?You may urinate more often because the fetus is pressing on your bladder. ?You may develop or continue to have heartburn as a result of your pregnancy. ?You may develop constipation because certain hormones are causing the muscles that push waste through your intestines to slow down. ?You may develop hemorrhoids or swollen, bulging veins (varicose veins). ?You may have back pain. This is caused by: ?Weight gain. ?Pregnancy hormones that are relaxing the joints in your pelvis. ?A shift in weight and the muscles that support your balance. ?Your breasts will continue to grow and they will continue to become tender. ?Your gums may bleed and may be sensitive to brushing and flossing. ?Dark spots or blotches (chloasma, mask of pregnancy) may develop on your face. This will likely fade after the baby is born. ?A dark line from your belly button to  the pubic area (linea nigra) may appear. This will likely fade after the baby is born. ?You may have changes in your hair. These can include thickening of your hair, rapid growth, and changes in texture. Some women also have hair loss during or after pregnancy, or hair that feels dry or thin. Your hair will most likely return to normal after your baby is born. ? ?What to expect at prenatal visits ?During a routine prenatal visit: ?You will be weighed to make sure you and the fetus are growing normally. ?Your blood pressure will be taken. ?Your abdomen will be measured to track your baby's growth. ?The fetal heartbeat will be listened to. ?Any test results from the previous visit will be discussed. ? ?Your health care provider may ask you: ?How you are feeling. ?If you are feeling the baby move. ?If you have had any abnormal symptoms, such as leaking fluid, bleeding, severe headaches, or abdominal cramping. ?If you are using any tobacco products, including cigarettes, chewing tobacco, and electronic cigarettes. ?If you have any questions. ? ?Other tests that may be performed during  your second trimester include: ?Blood tests that check for: ?Low iron levels (anemia). ?High blood sugar that affects pregnant women (gestational diabetes) between 59 and 28 weeks. ?Rh antibodies. This is to check for a protein on red blood cells (Rh factor). ?Urine tests to check for infections, diabetes, or protein in the urine. ?An ultrasound to confirm the proper growth and development of the baby. ?An amniocentesis to check for possible genetic problems. ?Fetal screens for spina bifida and Down syndrome. ?HIV (human immunodeficiency virus) testing. Routine prenatal testing includes screening for HIV, unless you choose not to have this test. ? ?Follow these instructions at home: ?Medicines ?Follow your health care provider's instructions regarding medicine use. Specific medicines may be either safe or unsafe to take during  pregnancy. ?Take a prenatal vitamin that contains at least 600 micrograms (mcg) of folic acid. ?If you develop constipation, try taking a stool softener if your health care provider approves. ?Eating and drinking ?Eat

## 2021-09-09 NOTE — Progress Notes (Signed)
? ? ?LOW-RISK PREGNANCY VISIT ?Patient name: Stacy Richardson MRN [id number]  Date of birth: [birth year] ?Chief Complaint:   ?Routine Prenatal Visit ? ?History of Present Illness:   ?Stacy Richardson is a 21 y.o. G76P1001 female at [redacted]w[redacted]d with an Estimated Date of Delivery: 01/16/22 being seen today for ongoing management of a low-risk pregnancy.  ? ?Today she reports  Lt breast flaky skin and painful, went to urgent care, rx'd steroid cream, hasn't tried yet . Has tried moisturizers and not helping. Contractions: Not present. Vag. Bleeding: None.  Movement: Present. denies leaking of fluid. ? ?Depression screen [facility] 2/9 07/09/2021 04/01/2021 09/10/2019  ?Decreased Interest 0 0 0  ?Down, Depressed, Hopeless 0 0 0  ?PHQ - 2 Score 0 0 0  ?Altered sleeping 1 0 -  ?Tired, decreased energy 1 0 -  ?Change in appetite 1 1 -  ?Feeling bad or failure about yourself  0 0 -  ?Trouble concentrating 0 0 -  ?Moving slowly or fidgety/restless 0 0 -  ?Suicidal thoughts - 0 -  ?PHQ-9 Score 3 1 -  ? ?  ?GAD 7 : Generalized Anxiety Score 07/09/2021 04/01/2021  ?Nervous, Anxious, on Edge 1 0  ?Control/stop worrying 0 0  ?Worry too much - different things 1 1  ?Trouble relaxing 0 0  ?Restless 0 0  ?Easily annoyed or irritable 1 0  ?Afraid - awful might happen 0 0  ?Total GAD 7 Score 3 1  ? ? ?  ?Review of Systems:   ?Pertinent items are noted in HPI ?Denies abnormal vaginal discharge w/ itching/odor/irritation, headaches, visual changes, shortness of breath, chest pain, abdominal pain, severe nausea/vomiting, or problems with urination or bowel movements unless otherwise stated above. ?Pertinent History Reviewed:  ?Reviewed past medical,surgical, social, obstetrical and family history.  ?Reviewed problem list, medications and allergies. ?Physical Assessment:  ? ?Vitals:  ? 09/09/21 1346  ?BP: 112/66  ?Pulse: 83  ?Weight: 133 lb (60.3 kg)  ?Body mass index is 22.83 kg/m?. ?  ?     Physical Examination:  ? General appearance: Well appearing, and in no  distress ? Mental status: Alert, oriented to person, place, and time ? Skin: Warm & dry ? Cardiovascular: Normal heart rate noted ? Lt breast: areola looks a little dry ? Respiratory: Normal respiratory effort, no distress ? Abdomen: Soft, gravid, nontender ? Pelvic: Cervical exam deferred        ? Extremities: Edema: None ? ?Fetal Status: Fetal Heart Rate (bpm): 160   Movement: Present   ? ?Chaperone: N/A   ?No results found for this or any previous visit (from the past 24 hour(s)).  ?Assessment & Plan:  ?1) Low-risk pregnancy G2P1001 at [redacted]w[redacted]d with an Estimated Date of Delivery: 01/16/22  ? ?2) Lt breast flaky/dry skin, pain, has tried moisturizers w/o relief. Try the steroid cream rx'd by urgent care, if not helping, let us know ?  ?Meds: No orders of the defined types were placed in this encounter. ? ?Labs/procedures today: AFP ? ?Plan:  Continue routine obstetrical care  ?Next visit: prefers in person   ? ?Reviewed: Preterm labor symptoms and general obstetric precautions including but not limited to vaginal bleeding, contractions, leaking of fluid and fetal movement were reviewed in detail with the patient.  All questions were answered. Does have home bp cuff. Office bp cuff given: not applicable. Check bp weekly, let us know if consistently >140 and/or >90. ? ?Follow-up: Return in about 4 weeks (around 10/07/2021) for LROB, CNM, in person;  then 7wks from now for LROB w/ PN2 in person w/ CNM. ? ?No future appointments. ? ?No orders of the defined types were placed in this encounter. ? ?Cheral Marker CNM, WHNP-BC ?09/09/2021 ?2:06 PM  ?

## 2021-09-11 LAB — AFP, SERUM, OPEN SPINA BIFIDA
AFP MoM: 1.26
AFP Value: 106.1 ng/mL
Gest. Age on Collection Date: 21.4 weeks
Maternal Age At EDD: 21.9 yr
OSBR Risk 1 IN: 10000
Test Results:: NEGATIVE
Weight: 131 [lb_av]

## 2021-10-07 ENCOUNTER — Encounter: Payer: Self-pay | Admitting: Women's Health

## 2021-10-07 ENCOUNTER — Ambulatory Visit (INDEPENDENT_AMBULATORY_CARE_PROVIDER_SITE_OTHER): Payer: Medicaid Other | Admitting: Women's Health

## 2021-10-07 VITALS — BP 112/67 | HR 85 | Wt 138.0 lb

## 2021-10-07 DIAGNOSIS — Z3482 Encounter for supervision of other normal pregnancy, second trimester: Secondary | ICD-10-CM

## 2021-10-07 DIAGNOSIS — Z3480 Encounter for supervision of other normal pregnancy, unspecified trimester: Secondary | ICD-10-CM

## 2021-10-07 NOTE — Progress Notes (Signed)
? ? ?LOW-RISK PREGNANCY VISIT ?Patient name: Stacy Richardson MRN [id number]  Date of birth: 03/14/2001 ?Chief Complaint:   ?Routine Prenatal Visit ? ?History of Present Illness:   ?Stacy Richardson is a 21 y.o. G67P1001 female at [redacted]w[redacted]d with an Estimated Date of Delivery: 01/16/22 being seen today for ongoing management of a low-risk pregnancy.  ? ?Today she reports no complaints. Contractions: Not present. Vag. Bleeding: None.  Movement: Present. denies leaking of fluid. ? ? ?  07/09/2021  ? 11:44 AM 04/01/2021  ?  9:12 AM 09/10/2019  ?  3:27 PM  ?Depression screen PHQ 2/9  ?Decreased Interest 0 0 0  ?Down, Depressed, Hopeless 0 0 0  ?PHQ - 2 Score 0 0 0  ?Altered sleeping 1 0   ?Tired, decreased energy 1 0   ?Change in appetite 1 1   ?Feeling bad or failure about yourself  0 0   ?Trouble concentrating 0 0   ?Moving slowly or fidgety/restless 0 0   ?Suicidal thoughts  0   ?PHQ-9 Score 3 1   ? ?  ? ?  07/09/2021  ? 11:45 AM 04/01/2021  ?  9:12 AM  ?GAD 7 : Generalized Anxiety Score  ?Nervous, Anxious, on Edge 1 0  ?Control/stop worrying 0 0  ?Worry too much - different things 1 1  ?Trouble relaxing 0 0  ?Restless 0 0  ?Easily annoyed or irritable 1 0  ?Afraid - awful might happen 0 0  ?Total GAD 7 Score 3 1  ? ? ?  ?Review of Systems:   ?Pertinent items are noted in HPI ?Denies abnormal vaginal discharge w/ itching/odor/irritation, headaches, visual changes, shortness of breath, chest pain, abdominal pain, severe nausea/vomiting, or problems with urination or bowel movements unless otherwise stated above. ?Pertinent History Reviewed:  ?Reviewed past medical,surgical, social, obstetrical and family history.  ?Reviewed problem list, medications and allergies. ?Physical Assessment:  ? ?Vitals:  ? 10/07/21 1441  ?BP: 112/67  ?Pulse: 85  ?Weight: 138 lb (62.6 kg)  ?Body mass index is 23.69 kg/m?. ?  ?     Physical Examination:  ? General appearance: Well appearing, and in no distress ? Mental status: Alert, oriented to person, place,  and time ? Skin: Warm & dry ? Cardiovascular: Normal heart rate noted ? Respiratory: Normal respiratory effort, no distress ? Abdomen: Soft, gravid, nontender ? Pelvic: Cervical exam deferred        ? Extremities: Edema: None ? ?Fetal Status: Fetal Heart Rate (bpm): 152 Fundal Height: 23 cm Movement: Present   ? ?Chaperone: N/A   ?No results found for this or any previous visit (from the past 24 hour(s)).  ?Assessment & Plan:  ?1) Low-risk pregnancy G2P1001 at [redacted]w[redacted]d with an Estimated Date of Delivery: 01/16/22  ?  ?Meds: No orders of the defined types were placed in this encounter. ? ?Labs/procedures today: none ? ?Plan:  Continue routine obstetrical care  ?Next visit: prefers will be in person for pn2    ? ?Reviewed: Preterm labor symptoms and general obstetric precautions including but not limited to vaginal bleeding, contractions, leaking of fluid and fetal movement were reviewed in detail with the patient.  All questions were answered. Does have home bp cuff. Office bp cuff given: not applicable. Check bp weekly, let us know if consistently >140 and/or >90. ? ?Follow-up: Return for As scheduled. ? ?Future Appointments  ?Date Time Provider Department Center  ?10/29/2021  8:30 AM [facility]  ?10/29/2021  9:10 AM Myna Hidalgo, DO  [facility]  ? ? ?No orders of the defined types were placed in this encounter. ? ?Cheral Marker CNM, WHNP-BC ?10/07/2021 ?2:55 PM  ?

## 2021-10-07 NOTE — Patient Instructions (Signed)
[place], thank you for choosing our office today! We appreciate the opportunity to meet your healthcare needs. You may receive a short survey by mail, e-mail, or through [employer]. If you are happy with your care we would appreciate if you could take just a few minutes to complete the survey questions. We read all of your comments and take your feedback very seriously. Thank you again for choosing our office.  ?Center for [employer] Team at [facility] ? ?[facility] ?([address] [place], [place] [address]) ?Entrance C, located off of [address] ?Free 24/7 valet parking  ? ?You will have your sugar test next visit.  Please do not eat or drink anything after midnight the night before you come, not even water.  You will be here for at least two hours.  Please make an appointment online for the bloodwork at [email or web address] for 8:00am (or as close to this as possible). Make sure you select the [facility] service center.  ? ?CLASSES: Go to [email or web address] to register for classes (childbirth, breastfeeding, waterbirth, infant CPR, daddy bootcamp, etc.) ? ?Call the office [phone]) or go to [facility] if: ?You begin to have strong, frequent contractions ?Your water breaks.  Sometimes it is a big gush of fluid, sometimes it is just a trickle that keeps getting your panties wet or running down your legs ?You have vaginal bleeding.  It is normal to have a small amount of spotting if your cervix was checked.  ?You don't feel your baby moving like normal.  If you don't, get you something to eat and drink and lay down and focus on feeling your baby move.   If your baby is still not moving like normal, you should call the office or go to [facility]. ? ?Call the office [phone]) or go to [facility] hospital for these signs of pre-eclampsia: ?Severe headache that does not go away with Tylenol ?Visual changes- seeing spots, double, blurred vision ?Pain under your right breast or upper  abdomen that does not go away with Tums or heartburn medicine ?Nausea and/or vomiting ?Severe swelling in your hands, feet, and face  ? ? ?[place] Pediatricians/Family Doctors ? [facility]): [address], [phone]           ?[facility]: [address], [phone]                ?[facility]): [address], [phone] (call to ask if accepting patients) ?[facility] Department: [address], [place], [phone]   ? ?[place] Pediatricians/Family Doctors ?[facility]): [address], [phone] ?[facility]: [address] [place], [phone] ?[facility] [place]: [address], [phone] ? ?[employer] Family Doctors  ?[facility]): [phone] ?[facility]: [address], [phone]  ? ?[facility] Family Doctors ?[facility]: [address], [phone] ? ?[facility] Family Doctors  ?[facility]: [phone], [phone] ? ?Home Blood Pressure Monitoring for Patients  ? ?Your provider has recommended that you check your blood pressure (BP) at least once a week at home. If you do not have a blood pressure cuff at home, one will be provided for you. Contact your provider if you have not received your monitor within 1 week.  ? ?Helpful Tips for Accurate Home Blood Pressure Checks  ?Don't smoke, exercise, or drink  caffeine 30 minutes before checking your BP ?Use the restroom before checking your BP (a full bladder can raise your pressure) ?Relax in a comfortable upright chair ?Feet on the ground ?Left arm resting comfortably on a flat surface at the level of your heart ?Legs uncrossed ?Back supported ?Sit quietly and don't talk ?Place the cuff on your bare arm ?Adjust snuggly, so that only two fingertips can fit between your skin and the top of the cuff ?Check 2  readings separated by at least one minute ?Keep a log of your BP readings ?For a visual, please reference this diagram: http://ccnc.care/bpdiagram ? ?Provider Name: [facility]     Phone: [phone] ? ?Zone 1: ALL CLEAR  ?Continue to monitor your symptoms:  ?BP reading is less than 140 (top number) or less than 90 (bottom number)  ?No right upper stomach pain ?No headaches or seeing spots ?No feeling nauseated or throwing up ?No swelling in face and hands ? ?Zone 2: CAUTION ?Call your doctor's office for any of the following:  ?BP reading is greater than 140 (top number) or greater than 90 (bottom number)  ?Stomach pain under your ribs in the middle or right side ?Headaches or seeing spots ?Feeling nauseated or throwing up ?Swelling in face and hands ? ?Zone 3: EMERGENCY  ?Seek immediate medical care if you have any of the following:  ?BP reading is greater than160 (top number) or greater than 110 (bottom number) ?Severe headaches not improving with Tylenol ?Serious difficulty catching your breath ?Any worsening symptoms from Zone 2  ? ?Second Trimester of Pregnancy ?The second trimester is from week 13 through week 28, months 4 through 6. The second trimester is often a time when you feel your best. Your body has also adjusted to being pregnant, and you begin to feel better physically. Usually, morning sickness has lessened or quit completely, you may have more energy, and you may have an increase in appetite. The second trimester is also a time when the fetus is growing rapidly. At the end of the sixth month, the fetus is about 9 inches long and weighs about 1? pounds. You will likely begin to feel the baby move (quickening) between 18 and 20 weeks of the pregnancy. ?BODY CHANGES ?Your body goes through many changes during pregnancy. The changes vary from woman to woman.  ?Your weight will continue to increase. You will notice your lower abdomen bulging out. ?You may begin to get stretch marks on your  hips, abdomen, and breasts. ?You may develop headaches that can be relieved by medicines approved by your health care provider. ?You may urinate more often because the fetus is pressing on your bladder. ?You may develop or continue to have heartburn as a result of your pregnancy. ?You may develop constipation because certain hormones are causing the muscles that push waste through your intestines to slow down. ?You may develop hemorrhoids or swollen, bulging veins (varicose veins). ?You may have back pain because of the weight gain and pregnancy hormones relaxing your joints between the bones in your pelvis and as a result of a shift in weight and the muscles that support your balance. ?Your breasts will continue to grow and be tender. ?Your gums may bleed and may be sensitive to brushing and flossing. ?Dark spots or blotches (chloasma, mask of pregnancy) may develop on your face. This will likely fade after the baby is born. ?A dark line from your belly button to the pubic area (linea nigra) may appear. This  will likely fade after the baby is born. ?You may have changes in your hair. These can include thickening of your hair, rapid growth, and changes in texture. Some women also have hair loss during or after pregnancy, or hair that feels dry or thin. Your hair will most likely return to normal after your baby is born. ?WHAT TO EXPECT AT YOUR PRENATAL VISITS ?During a routine prenatal visit: ?You will be weighed to make sure you and the fetus are growing normally. ?Your blood pressure will be taken. ?Your abdomen will be measured to track your baby's growth. ?The fetal heartbeat will be listened to. ?Any test results from the previous visit will be discussed. ?Your health care provider may ask you: ?How you are feeling. ?If you are feeling the baby move. ?If you have had any abnormal symptoms, such as leaking fluid, bleeding, severe headaches, or abdominal cramping. ?If you have any questions. ?Other tests that may  be performed during your second trimester include: ?Blood tests that check for: ?Low iron levels (anemia). ?Gestational diabetes (between 24 and 28 weeks). ?Rh antibodies. ?Urine tests to check for infections

## 2021-10-29 ENCOUNTER — Other Ambulatory Visit: Payer: Medicaid Other

## 2021-10-29 ENCOUNTER — Ambulatory Visit (INDEPENDENT_AMBULATORY_CARE_PROVIDER_SITE_OTHER): Payer: Medicaid Other | Admitting: Obstetrics & Gynecology

## 2021-10-29 ENCOUNTER — Encounter: Payer: Self-pay | Admitting: Obstetrics & Gynecology

## 2021-10-29 VITALS — BP 118/72 | HR 93 | Wt 142.2 lb

## 2021-10-29 DIAGNOSIS — Z23 Encounter for immunization: Secondary | ICD-10-CM

## 2021-10-29 DIAGNOSIS — Z131 Encounter for screening for diabetes mellitus: Secondary | ICD-10-CM

## 2021-10-29 DIAGNOSIS — Z3483 Encounter for supervision of other normal pregnancy, third trimester: Secondary | ICD-10-CM

## 2021-10-29 DIAGNOSIS — Z3A28 28 weeks gestation of pregnancy: Secondary | ICD-10-CM

## 2021-10-29 DIAGNOSIS — Z3403 Encounter for supervision of normal first pregnancy, third trimester: Secondary | ICD-10-CM

## 2021-10-29 NOTE — Progress Notes (Signed)
? ?  LOW-RISK PREGNANCY VISIT ?Patient name: Stacy Richardson MRN [id number]  Date of birth: 2001-04-01 ?Chief Complaint:   ?Routine Prenatal Visit and PN2 ? ?History of Present Illness:   ?Stacy Richardson is a 21 y.o. G21P1001 female at [redacted]w[redacted]d with an Estimated Date of Delivery: 01/16/22 being seen today for ongoing management of a low-risk pregnancy.  ? ? ?  10/29/2021  ?  9:09 AM 07/09/2021  ? 11:44 AM 04/01/2021  ?  9:12 AM 09/10/2019  ?  3:27 PM  ?Depression screen PHQ 2/9  ?Decreased Interest 1 0 0 0  ?Down, Depressed, Hopeless 0 0 0 0  ?PHQ - 2 Score 1 0 0 0  ?Altered sleeping 0 1 0   ?Tired, decreased energy 1 1 0   ?Change in appetite 0 1 1   ?Feeling bad or failure about yourself  0 0 0   ?Trouble concentrating 0 0 0   ?Moving slowly or fidgety/restless 0 0 0   ?Suicidal thoughts 0  0   ?PHQ-9 Score 2 3 1    ? ? ?Today she reports no complaints. Contractions: Not present. Vag. Bleeding: None.  Movement: Present. denies leaking of fluid. ?Review of Systems:   ?Pertinent items are noted in HPI ?Denies abnormal vaginal discharge w/ itching/odor/irritation, headaches, visual changes, shortness of breath, chest pain, abdominal pain, severe nausea/vomiting, or problems with urination or bowel movements unless otherwise stated above. ?Pertinent History Reviewed:  ?Reviewed past medical,surgical, social, obstetrical and family history.  ?Reviewed problem list, medications and allergies. ? ?Physical Assessment:  ? ?Vitals:  ? 10/29/21 0912  ?BP: 118/72  ?Pulse: 93  ?Weight: 142 lb 3.2 oz (64.5 kg)  ?Body mass index is 24.41 kg/m?. ?  ?     Physical Examination:  ? General appearance: Well appearing, and in no distress ? Mental status: Alert, oriented to person, place, and time ? Skin: Warm & dry ? Respiratory: Normal respiratory effort, no distress ? Abdomen: Soft, gravid, nontender ? Pelvic: Cervical exam deferred        ? Extremities: Edema: None ? Psych:  mood and affect appropriate ? ?Fetal Status: Fetal Heart Rate (bpm): 155  Fundal Height: 27 cm Movement: Present   ? ?Chaperone: n/a   ? ?No results found for this or any previous visit (from the past 24 hour(s)).  ? ?Assessment & Plan:  ?1) Low-risk pregnancy G2P1001 at [redacted]w[redacted]d with an Estimated Date of Delivery: 01/16/22  ? ?-recently moved to [place], discussed transfer to [place] office ? ?  ?Meds: No orders of the defined types were placed in this encounter. ? ?Labs/procedures today: PN2, Tdap today ? ?Plan:  Continue routine obstetrical care  ?Next visit: prefers in person   ? ?Reviewed: Preterm labor symptoms and general obstetric precautions including but not limited to vaginal bleeding, contractions, leaking of fluid and fetal movement were reviewed in detail with the patient.  All questions were answered. Pt has home bp cuff. Check bp weekly, let us know if >140/90.  ? ?Follow-up: Return in about 2 weeks (around 11/12/2021) for [place] visit. ? ?Orders Placed This Encounter  ?Procedures  ? Tdap vaccine greater than or equal to 7yo IM  ? ? ?Janyth Pupa, DO ?Attending [place], Faculty Practice ?Center for [place] ? ? ? ?

## 2021-10-30 LAB — GLUCOSE TOLERANCE, 2 HOURS W/ 1HR
Glucose, 1 hour: 132 mg/dL (ref 70–179)
Glucose, 2 hour: 116 mg/dL (ref 70–152)
Glucose, Fasting: 80 mg/dL (ref 70–91)

## 2021-10-30 LAB — CBC
Hematocrit: 34.8 % (ref 34.0–46.6)
Hemoglobin: 11.5 g/dL (ref 11.1–15.9)
MCH: 28.4 pg (ref 26.6–33.0)
MCHC: 33 g/dL (ref 31.5–35.7)
MCV: 86 fL (ref 79–97)
Platelets: 263 10*3/uL (ref 150–450)
RBC: 4.05 x10E6/uL (ref 3.77–5.28)
RDW: 12.5 % (ref 11.7–15.4)
WBC: 8.7 10*3/uL (ref 3.4–10.8)

## 2021-10-30 LAB — ANTIBODY SCREEN: Antibody Screen: NEGATIVE

## 2021-10-30 LAB — RPR: RPR Ser Ql: NONREACTIVE

## 2021-10-30 LAB — HIV ANTIBODY (ROUTINE TESTING W REFLEX): HIV Screen 4th Generation wRfx: NONREACTIVE

## 2021-11-10 NOTE — Progress Notes (Signed)
   PRENATAL VISIT NOTE  Subjective:  Stacy Richardson is a 21 y.o. G2P1001 at [redacted]w[redacted]d being seen today for ongoing prenatal care.  She is currently monitored for the following issues for this low-risk pregnancy and has History of PID; Encounter for supervision of normal pregnancy, antepartum; and Abnormal chromosomal and genetic finding on antenatal screening mother on their problem list.  Patient reports no complaints.  Contractions: Not present. Vag. Bleeding: None.  Movement: Present. Denies leaking of fluid.   The following portions of the patient's history were reviewed and updated as appropriate: allergies, current medications, past family history, past medical history, past social history, past surgical history and problem list.   Objective:   Vitals:   11/12/21 1034  BP: 110/70  Pulse: 96  Weight: 145 lb (65.8 kg)    Fetal Status: Fetal Heart Rate (bpm): 162 Fundal Height: 30 cm Movement: Present     General:  Alert, oriented and cooperative. Patient is in no acute distress.  Skin: Skin is warm and dry. No rash noted.   Cardiovascular: Normal heart rate noted  Respiratory: Normal respiratory effort, no problems with respiration noted  Abdomen: Soft, gravid, appropriate for gestational age.  Pain/Pressure: Absent     Pelvic: Cervical exam deferred        Extremities: Normal range of motion.  Edema: None  Mental Status: Normal mood and affect. Normal behavior. Normal judgment and thought content.   Assessment and Plan:  Pregnancy: G2P1001 at [redacted]w[redacted]d 1. Supervision of other normal pregnancy, antepartum PNC up to date  Preterm labor symptoms and general obstetric precautions including but not limited to vaginal bleeding, contractions, leaking of fluid and fetal movement were reviewed in detail with the patient. Please refer to After Visit Summary for other counseling recommendations.   Return in about 2 weeks (around 11/26/2021) for OB VISIT, MD or APP.  Future Appointments  Date  Time Provider Department Center  11/30/2021 10:30 AM Milas Hock, MD [facility]    Milas Hock, MD

## 2021-11-12 ENCOUNTER — Ambulatory Visit (INDEPENDENT_AMBULATORY_CARE_PROVIDER_SITE_OTHER): Payer: Medicaid Other | Admitting: Obstetrics and Gynecology

## 2021-11-12 ENCOUNTER — Encounter: Payer: Self-pay | Admitting: Obstetrics and Gynecology

## 2021-11-12 VITALS — BP 110/70 | HR 96 | Wt 145.0 lb

## 2021-11-12 DIAGNOSIS — Z3480 Encounter for supervision of other normal pregnancy, unspecified trimester: Secondary | ICD-10-CM

## 2021-11-12 DIAGNOSIS — Z3A30 30 weeks gestation of pregnancy: Secondary | ICD-10-CM

## 2021-11-26 NOTE — Progress Notes (Signed)
   PRENATAL VISIT NOTE  Subjective:  Stacy Richardson is a 21 y.o. G2P1001 at [redacted]w[redacted]d being seen today for ongoing prenatal care.  She is currently monitored for the following issues for this low-risk pregnancy and has History of PID; Encounter for supervision of normal pregnancy, antepartum; and Abnormal chromosomal and genetic finding on antenatal screening mother on their problem list.  Patient reports no complaints.  Contractions: Not present. Vag. Bleeding: None.  Movement: Present. Denies leaking of fluid.   The following portions of the patient's history were reviewed and updated as appropriate: allergies, current medications, past family history, past medical history, past social history, past surgical history and problem list.   Objective:   Vitals:   11/30/21 1036  BP: 119/74  Pulse: (!) 101  Weight: 149 lb (67.6 kg)    Fetal Status: Fetal Heart Rate (bpm): 150 Fundal Height: 32 cm Movement: Present     General:  Alert, oriented and cooperative. Patient is in no acute distress.  Skin: Skin is warm and dry. No rash noted.   Cardiovascular: Normal heart rate noted  Respiratory: Normal respiratory effort, no problems with respiration noted  Abdomen: Soft, gravid, appropriate for gestational age.  Pain/Pressure: Absent     Pelvic: Cervical exam deferred        Extremities: Normal range of motion.  Edema: None  Mental Status: Normal mood and affect. Normal behavior. Normal judgment and thought content.   Assessment and Plan:  Pregnancy: G2P1001 at [redacted]w[redacted]d 1. Supervision of other normal pregnancy, antepartum FH/BP normal  Continue routine PNC  Preterm labor symptoms and general obstetric precautions including but not limited to vaginal bleeding, contractions, leaking of fluid and fetal movement were reviewed in detail with the patient. Please refer to After Visit Summary for other counseling recommendations.   Return in about 2 weeks (around 12/14/2021) for OB VISIT, MD or  APP.  No future appointments.   Milas Hock, MD

## 2021-11-30 ENCOUNTER — Ambulatory Visit (INDEPENDENT_AMBULATORY_CARE_PROVIDER_SITE_OTHER): Payer: Medicaid Other | Admitting: Obstetrics and Gynecology

## 2021-11-30 ENCOUNTER — Encounter: Payer: Self-pay | Admitting: Obstetrics and Gynecology

## 2021-11-30 VITALS — BP 119/74 | HR 101 | Wt 149.0 lb

## 2021-11-30 DIAGNOSIS — Z3480 Encounter for supervision of other normal pregnancy, unspecified trimester: Secondary | ICD-10-CM

## 2021-12-22 ENCOUNTER — Ambulatory Visit (INDEPENDENT_AMBULATORY_CARE_PROVIDER_SITE_OTHER): Payer: Medicaid Other | Admitting: Obstetrics and Gynecology

## 2021-12-22 ENCOUNTER — Other Ambulatory Visit (HOSPITAL_COMMUNITY)
Admission: RE | Admit: 2021-12-22 | Discharge: 2021-12-22 | Disposition: A | Discharge status: Home / Self Care | Payer: Medicaid Other | Source: Ambulatory Visit | Attending: Obstetrics and Gynecology | Admitting: Obstetrics and Gynecology

## 2021-12-22 ENCOUNTER — Other Ambulatory Visit: Payer: Self-pay | Admitting: Obstetrics and Gynecology

## 2021-12-22 VITALS — BP 119/82 | HR 80 | Wt 156.0 lb

## 2021-12-22 DIAGNOSIS — Z3480 Encounter for supervision of other normal pregnancy, unspecified trimester: Secondary | ICD-10-CM | POA: Diagnosis not present

## 2021-12-22 DIAGNOSIS — Z3A36 36 weeks gestation of pregnancy: Secondary | ICD-10-CM | POA: Diagnosis not present

## 2021-12-22 MED ORDER — NYSTATIN 100000 UNIT/GM EX CREA
1.0000 | TOPICAL_CREAM | Freq: Two times a day (BID) | CUTANEOUS | 0 refills | Status: DC
Start: 2021-12-22 — End: 2022-01-16

## 2021-12-22 NOTE — Progress Notes (Signed)
Pt has noticed decreased fetal movement and increased pressure since Friday

## 2021-12-23 LAB — CERVICOVAGINAL ANCILLARY ONLY
Chlamydia: NEGATIVE
Comment: NEGATIVE
Comment: NORMAL
Neisseria Gonorrhea: NEGATIVE

## 2021-12-24 LAB — CULTURE, BETA STREP (GROUP B ONLY)
MICRO NUMBER:: 13582708
SPECIMEN QUALITY:: ADEQUATE

## 2021-12-25 ENCOUNTER — Encounter: Payer: Self-pay | Admitting: Obstetrics and Gynecology

## 2021-12-25 DIAGNOSIS — O99820 Streptococcus B carrier state complicating pregnancy: Secondary | ICD-10-CM | POA: Insufficient documentation

## 2021-12-28 ENCOUNTER — Ambulatory Visit (INDEPENDENT_AMBULATORY_CARE_PROVIDER_SITE_OTHER): Payer: Medicaid Other | Admitting: Obstetrics & Gynecology

## 2021-12-28 VITALS — BP 106/66 | HR 89 | Wt 157.0 lb

## 2021-12-28 DIAGNOSIS — O26843 Uterine size-date discrepancy, third trimester: Secondary | ICD-10-CM

## 2021-12-28 DIAGNOSIS — Z3480 Encounter for supervision of other normal pregnancy, unspecified trimester: Secondary | ICD-10-CM

## 2021-12-28 NOTE — Addendum Note (Signed)
Addended by: Kathie Dike on: 12/28/2021 10:46 AM   Modules accepted: Orders

## 2021-12-28 NOTE — Progress Notes (Signed)
   PRENATAL VISIT NOTE  Subjective:  Stacy Richardson is a 21 y.o. G2P1001 at [redacted]w[redacted]d being seen today for ongoing prenatal care.  She is currently monitored for the following issues for this low-risk pregnancy and has History of PID; Encounter for supervision of normal pregnancy, antepartum; Abnormal chromosomal and genetic finding on antenatal screening mother; and GBS (group B Streptococcus carrier), +RV culture, currently pregnant on their problem list.  Patient reports no complaints.  Contractions: Not present. Vag. Bleeding: None.  Movement: Present. Denies leaking of fluid.  Fetal movement is very good per patient report.   The following portions of the patient's history were reviewed and updated as appropriate: allergies, current medications, past family history, past medical history, past social history, past surgical history and problem list.   Objective:   Vitals:   12/28/21 0909  BP: 106/66  Pulse: 89  Weight: 157 lb (71.2 kg)    Fetal Status: Fetal Heart Rate (bpm): 145   Movement: Present     General:  Alert, oriented and cooperative. Patient is in no acute distress.  Skin: Skin is warm and dry. No rash noted.   Cardiovascular: Normal heart rate noted  Respiratory: Normal respiratory effort, no problems with respiration noted  Abdomen: Soft, gravid, appropriate for gestational age.  Pain/Pressure: Present     Pelvic: Cervical exam deferred        Extremities: Normal range of motion.  Edema: Trace  Mental Status: Normal mood and affect. Normal behavior. Normal judgment and thought content.   Assessment and Plan:  Pregnancy: G2P1001 at [redacted]w[redacted]d 1. Supervision of other normal pregnancy, antepartum GBS-Rx in labor Needs growth US--will send back to Dr. Despina Hidden since he ded her anatomy acan and she works in [place].   Term labor symptoms and general obstetric precautions including but not limited to vaginal bleeding, contractions, leaking of fluid and fetal movement were reviewed  in detail with the patient. Please refer to After Visit Summary for other counseling recommendations.   No follow-ups on file.  Future Appointments  Date Time Provider Department Center  01/05/2022  4:10 PM Brand Males, CNM [facility]  01/12/2022 10:50 AM Brand Males, CNM [facility]  01/19/2022 10:50 AM Rasch, Harolyn Rutherford, NP [facility]    Elsie Lincoln, MD

## 2022-01-04 ENCOUNTER — Ambulatory Visit: Payer: Medicaid Other

## 2022-01-04 ENCOUNTER — Ambulatory Visit: Payer: Medicaid Other | Admitting: *Deleted

## 2022-01-04 ENCOUNTER — Encounter: Payer: Self-pay | Admitting: *Deleted

## 2022-01-04 VITALS — BP 111/56 | HR 90

## 2022-01-04 DIAGNOSIS — Z3A38 38 weeks gestation of pregnancy: Secondary | ICD-10-CM | POA: Diagnosis not present

## 2022-01-04 DIAGNOSIS — Z3689 Encounter for other specified antenatal screening: Secondary | ICD-10-CM | POA: Insufficient documentation

## 2022-01-04 DIAGNOSIS — O26843 Uterine size-date discrepancy, third trimester: Secondary | ICD-10-CM | POA: Diagnosis present

## 2022-01-04 DIAGNOSIS — Z363 Encounter for antenatal screening for malformations: Secondary | ICD-10-CM

## 2022-01-05 ENCOUNTER — Ambulatory Visit (INDEPENDENT_AMBULATORY_CARE_PROVIDER_SITE_OTHER): Payer: Medicaid Other

## 2022-01-05 VITALS — BP 117/74 | HR 96 | Wt 157.0 lb

## 2022-01-05 DIAGNOSIS — Z3483 Encounter for supervision of other normal pregnancy, third trimester: Secondary | ICD-10-CM

## 2022-01-05 DIAGNOSIS — Z3A38 38 weeks gestation of pregnancy: Secondary | ICD-10-CM

## 2022-01-05 DIAGNOSIS — O99820 Streptococcus B carrier state complicating pregnancy: Secondary | ICD-10-CM

## 2022-01-05 DIAGNOSIS — Z3480 Encounter for supervision of other normal pregnancy, unspecified trimester: Secondary | ICD-10-CM

## 2022-01-05 NOTE — Progress Notes (Signed)
   PRENATAL VISIT NOTE  Subjective:  Stacy Richardson is a 21 y.o. G2P1001 at [redacted]w[redacted]d being seen today for ongoing prenatal care.  She is currently monitored for the following issues for this low-risk pregnancy and has History of PID; Encounter for supervision of normal pregnancy, antepartum; Abnormal chromosomal and genetic finding on antenatal screening mother; and GBS (group B Streptococcus carrier), +RV culture, currently pregnant on their problem list.  Patient reports no complaints.   . Vag. Bleeding: None.  Movement: Present. Denies leaking of fluid.   The following portions of the patient's history were reviewed and updated as appropriate: allergies, current medications, past family history, past medical history, past social history, past surgical history and problem list.   Objective:   Vitals:   01/05/22 1614  BP: 117/74  Pulse: 96  Weight: 157 lb (71.2 kg)    Fetal Status: Fetal Heart Rate (bpm): 157   Movement: Present     General:  Alert, oriented and cooperative. Patient is in no acute distress.  Skin: Skin is warm and dry. No rash noted.   Cardiovascular: Normal heart rate noted  Respiratory: Normal respiratory effort, no problems with respiration noted  Abdomen: Soft, gravid, appropriate for gestational age.  Pain/Pressure: Absent     Pelvic: Cervical exam performed in the presence of a chaperone Dilation: Closed Effacement (%): Thick    Extremities: Normal range of motion.  Edema: None  Mental Status: Normal mood and affect. Normal behavior. Normal judgment and thought content.   Assessment and Plan:  Pregnancy: G2P1001 at [redacted]w[redacted]d 1. Supervision of other normal pregnancy, antepartum - Routine OB. Doing well, no concerns - Requesting cervical exam today - Vertex via [facility] - Growth ultrasound yesterday reviewed. EFW 42%, AFI wnl - Anticipatory guidance for upcoming appointments  2. [redacted] weeks gestation of pregnancy - Endorses active movement  3. GBS (group B  Streptococcus carrier), +RV culture, currently pregnant - PCN in labor   Term labor symptoms and general obstetric precautions including but not limited to vaginal bleeding, contractions, leaking of fluid and fetal movement were reviewed in detail with the patient. Please refer to After Visit Summary for other counseling recommendations.   Return in about 1 week (around 01/12/2022) for ROB.  Future Appointments  Date Time Provider Department Center  01/12/2022 10:50 AM Brand Males, CNM [facility]  01/19/2022 10:50 AM Rasch, Harolyn Rutherford, NP [facility]    Brand Males, CNM

## 2022-01-07 ENCOUNTER — Ambulatory Visit: Payer: Medicaid Other

## 2022-01-11 IMAGING — US US TRANSVAGINAL NON-OB
1 series · 13 of 25 positions shown · non-contrast
Comparison: None

CLINICAL DATA: Lower pelvic pain question tubo-ovarian abscess,
vaginal bleeding off and on since Depo shot in January 2018



[Series 1: us transvaginal non-ob · 13 of 94 slices shown]
[im 1/94]
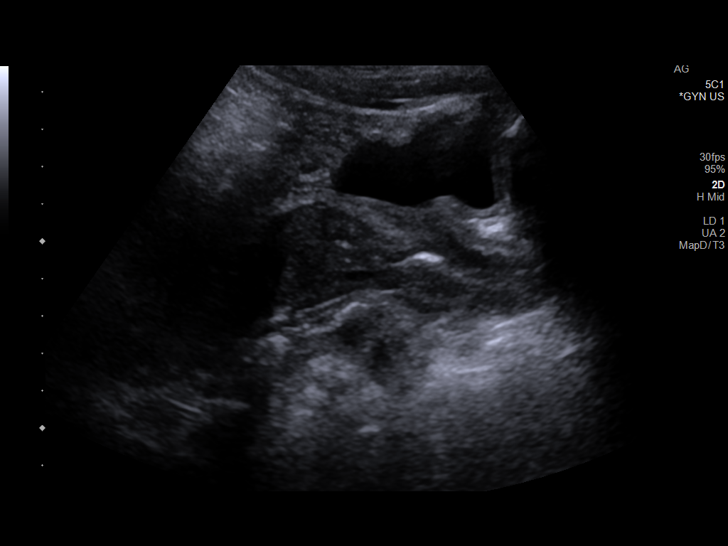
[im 8/94]
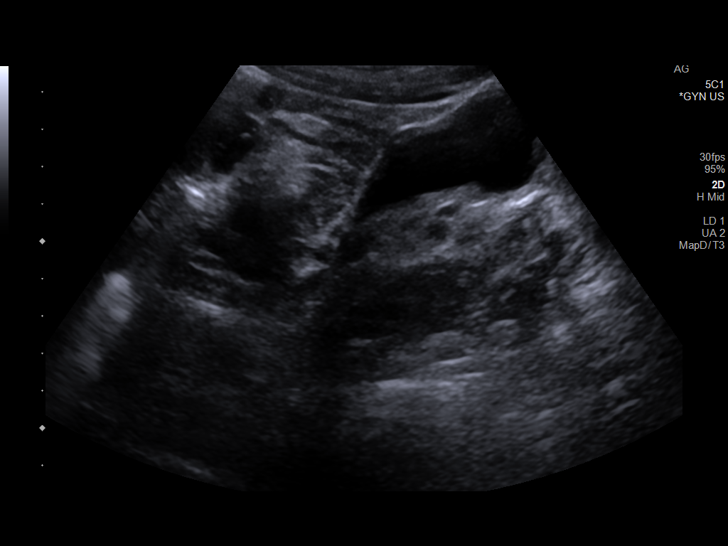
[im 16/94]
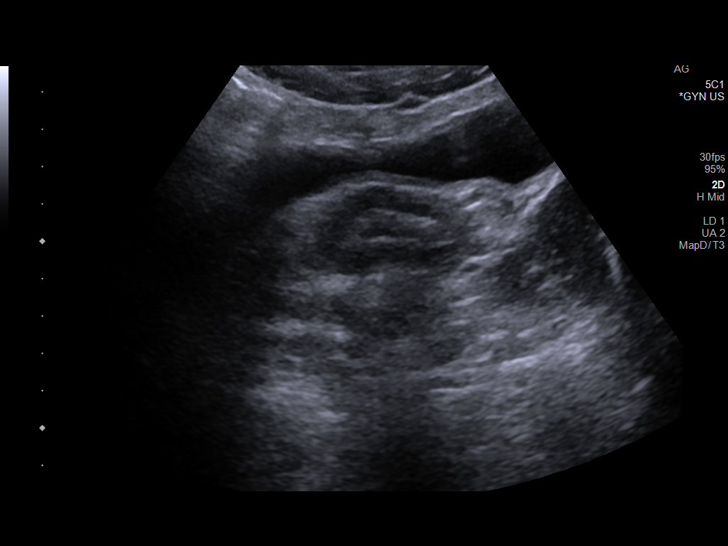
[im 24/94]
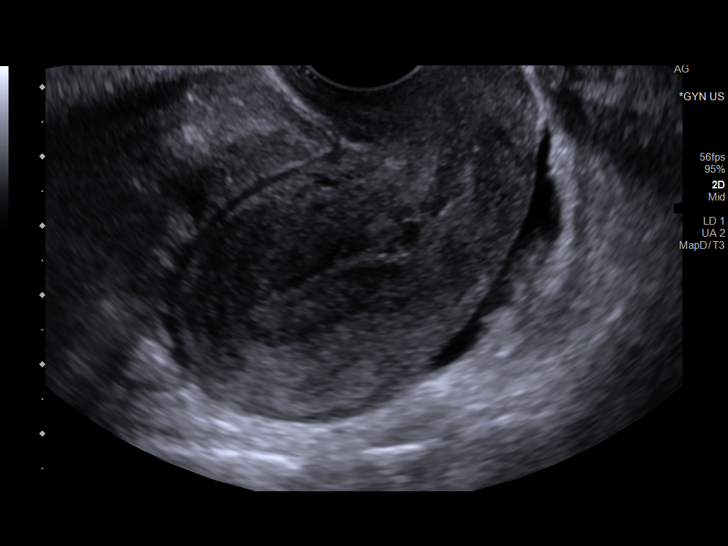
[im 32/94]
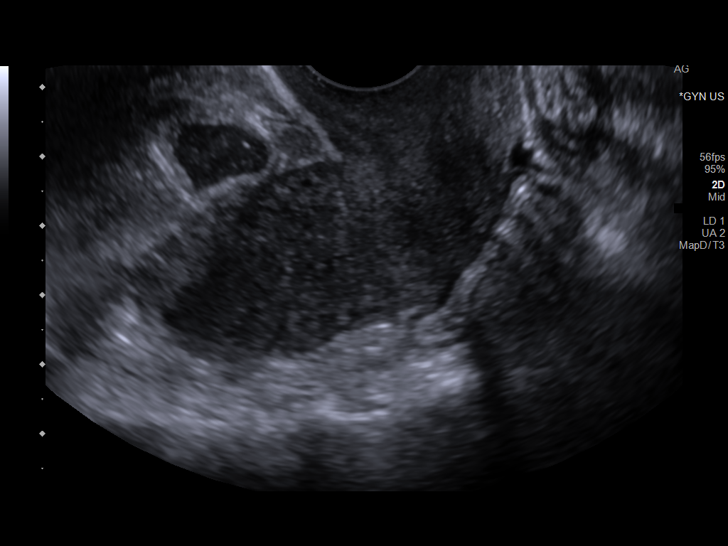
[im 39/94]
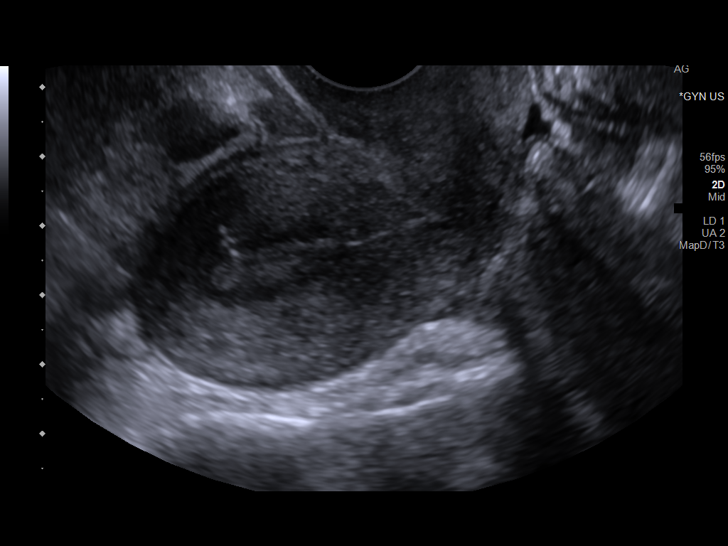
[im 47/94]
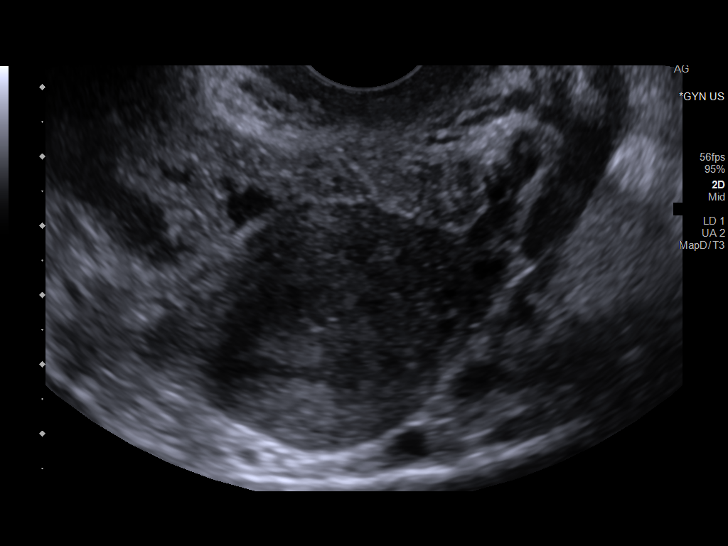
[im 55/94]
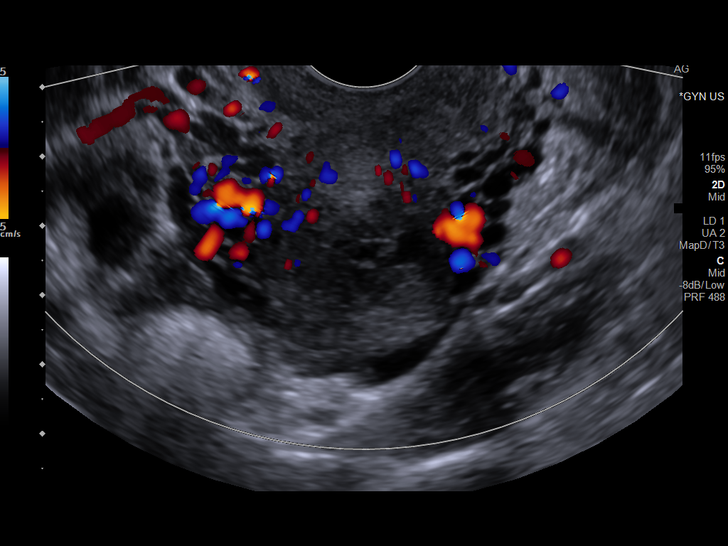
[im 63/94]
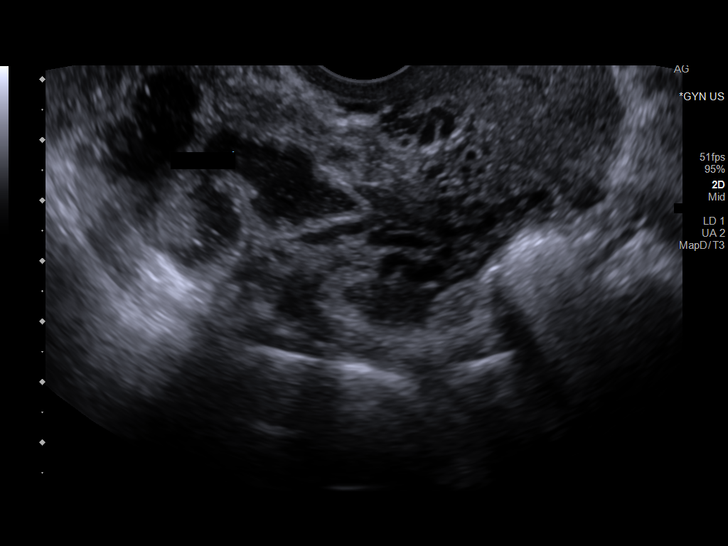
[im 70/94]
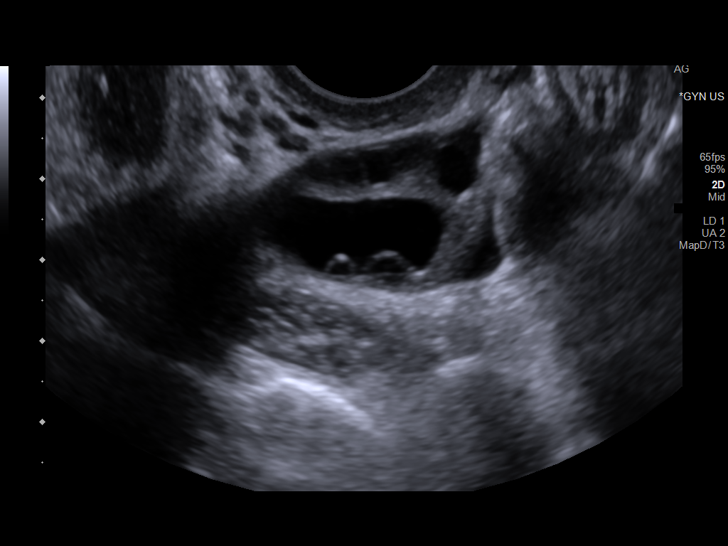
[im 78/94]
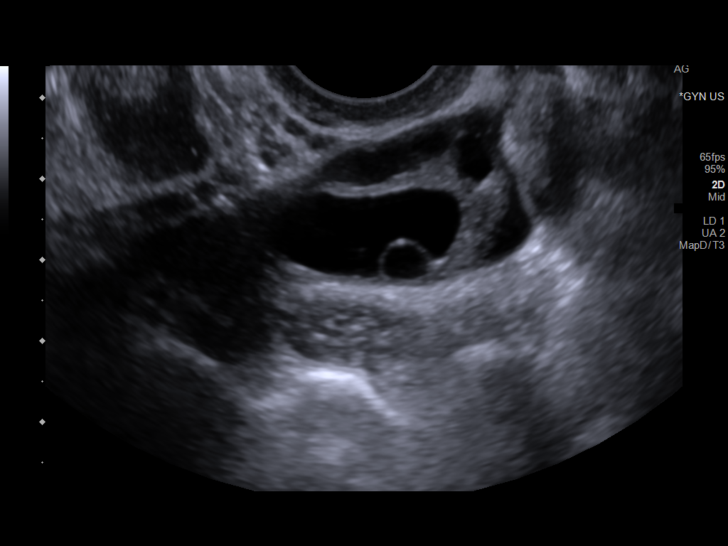
[im 86/94]
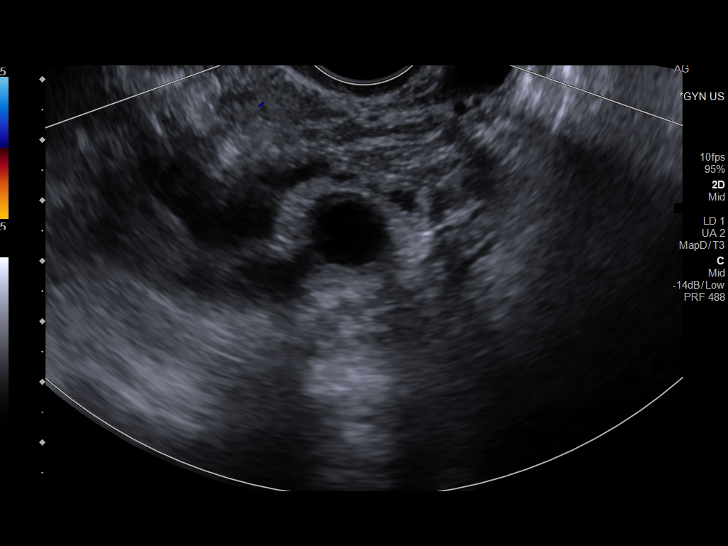
[im 94/94]
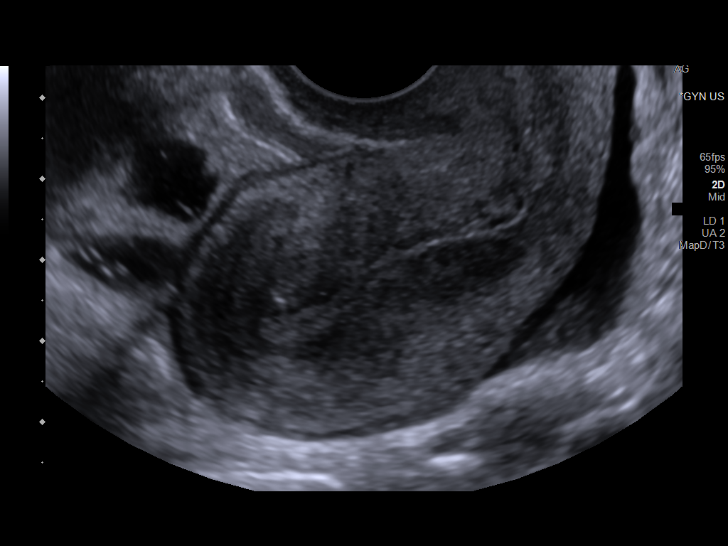

[13 of 25 positions shown; findings below may reference images not displayed]

FINDINGS: Uterus

Measurements: 6.9 x 3.5 x 4.1 cm = volume: 51 mL. Anteverted.
Slightly heterogeneous myometrial echogenicity. Small nonspecific
myometrial calcification at the upper uterus anteriorly. No discrete
uterine mass.

Endometrium

Thickness: 5 mm.  No endometrial fluid or focal abnormality

Right ovary

Measurements: 3.5 x 2.1 x 2.7 cm = volume: 10.1 mL. Mature graafian
follicle with tiny internal septation. No additional masses.

Left ovary

Measurements: 3.2 x 2.2 x 1.8 cm = volume: 6.4 mL. Dominant follicle
without mass

Other findings

Small amount of free pelvic fluid. No other adnexal masses. Numerous
vessels noted in the parametrial regions.
IMPRESSION: Small amount of nonspecific free pelvic fluid.

No acute additional pelvic sonographic abnormalities identified.

## 2022-01-11 IMAGING — US US PELVIS COMPLETE
1 series · 13 of 25 positions shown · non-contrast
Comparison: None

CLINICAL DATA: Lower pelvic pain question tubo-ovarian abscess,
vaginal bleeding off and on since Depo shot in January 2018



[Series 1: us pelvis complete · 13 of 94 slices shown]
[im 1/94]
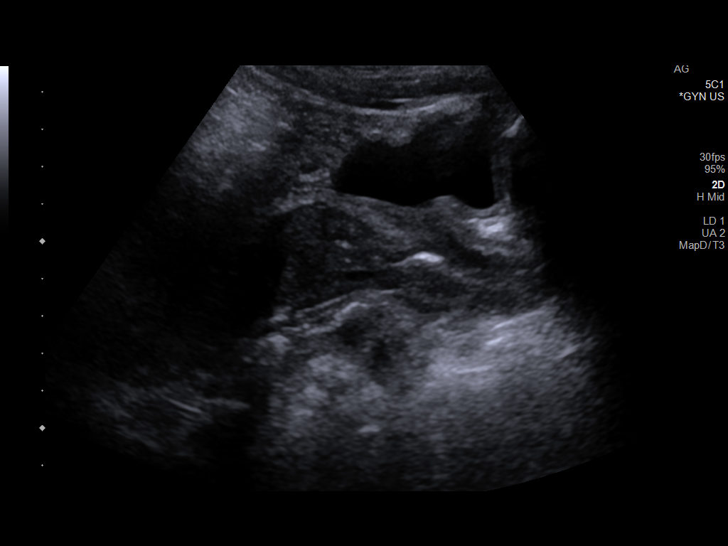
[im 8/94]
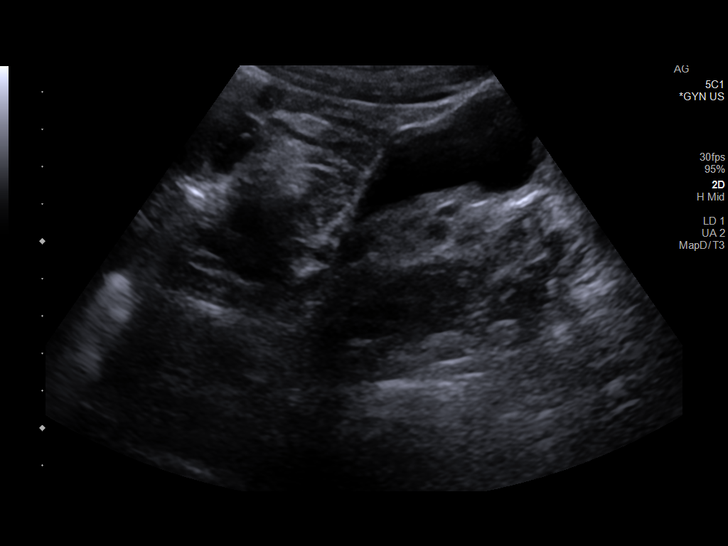
[im 16/94]
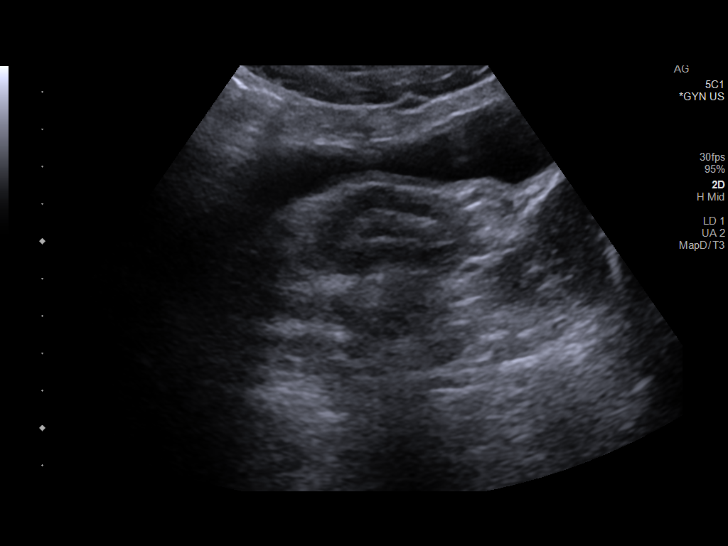
[im 24/94]
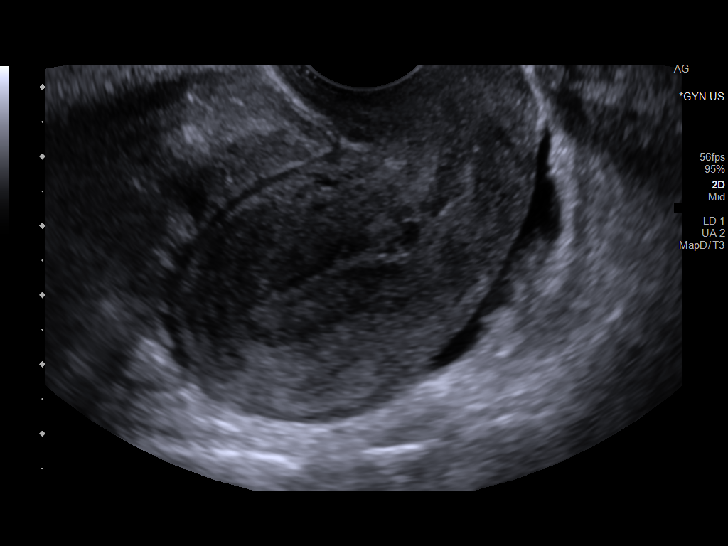
[im 32/94]
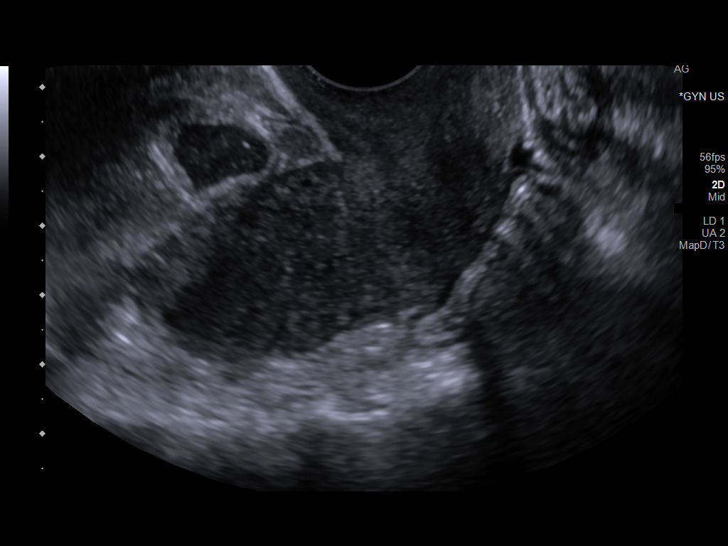
[im 39/94]
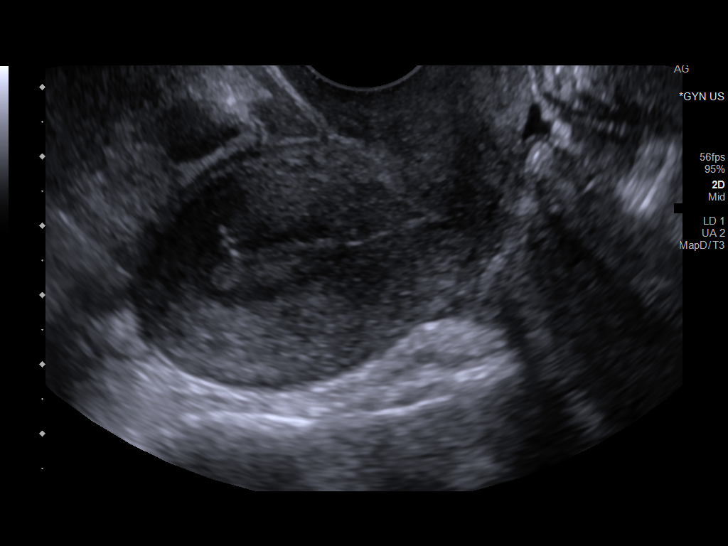
[im 47/94]
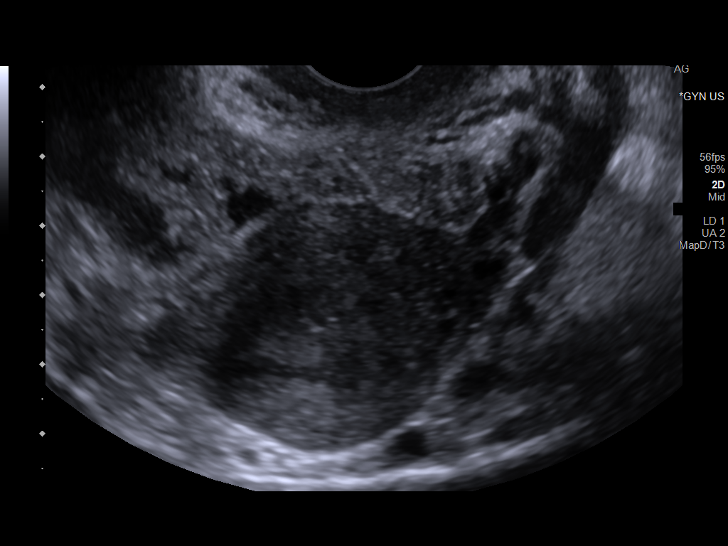
[im 55/94]
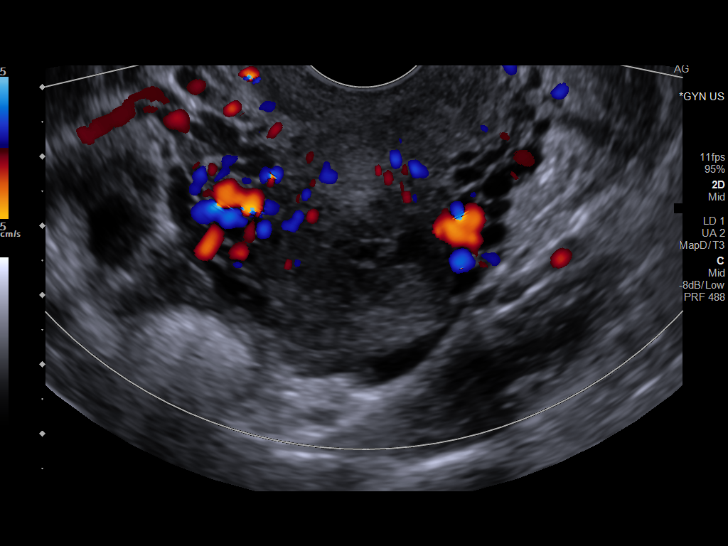
[im 63/94]
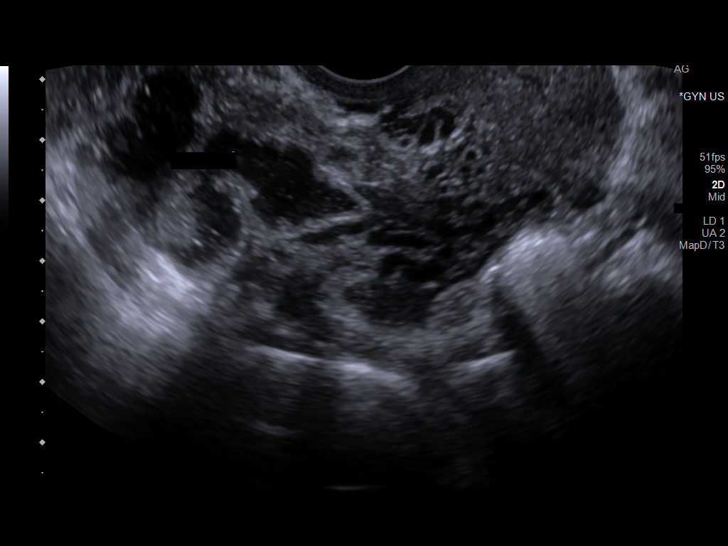
[im 70/94]
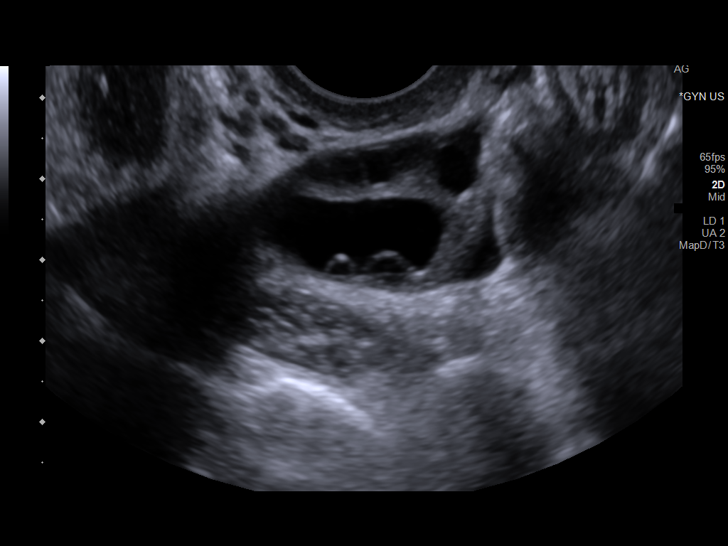
[im 78/94]
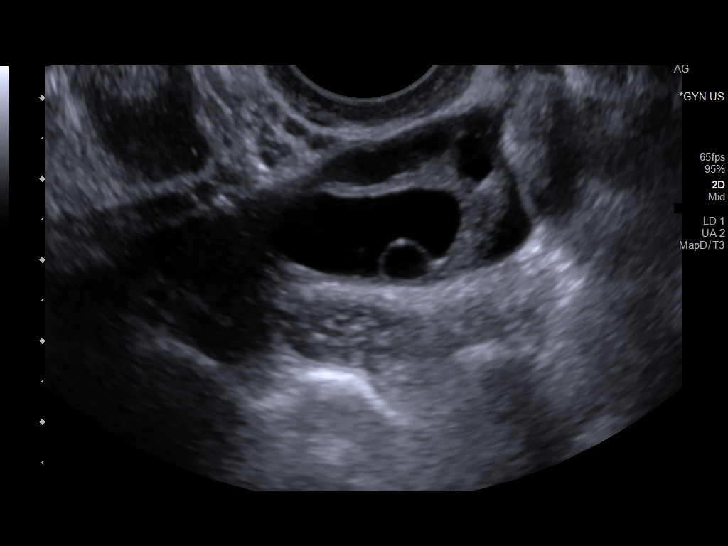
[im 86/94]
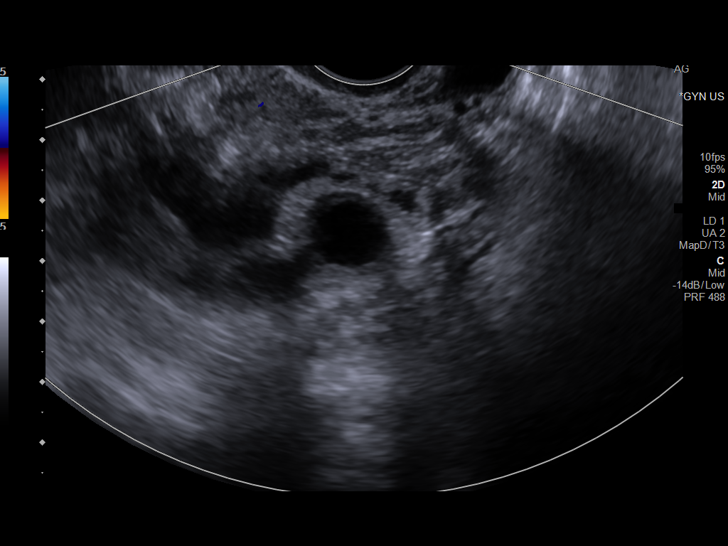
[im 94/94]
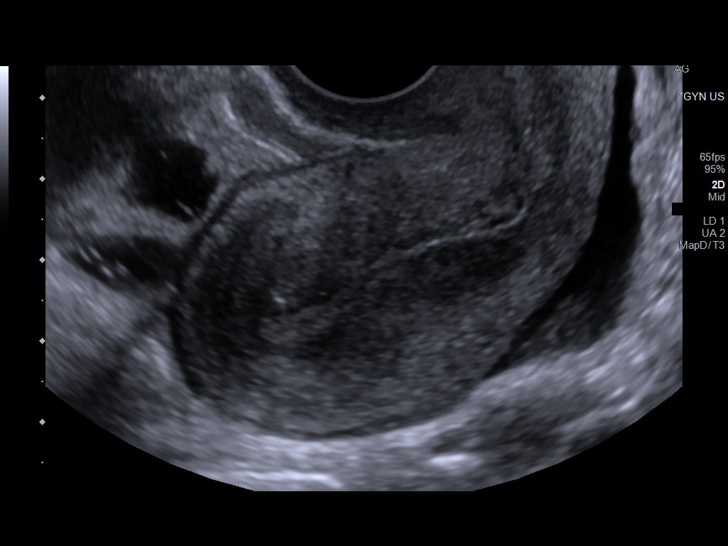

[13 of 25 positions shown; findings below may reference images not displayed]

FINDINGS: Uterus

Measurements: 6.9 x 3.5 x 4.1 cm = volume: 51 mL. Anteverted.
Slightly heterogeneous myometrial echogenicity. Small nonspecific
myometrial calcification at the upper uterus anteriorly. No discrete
uterine mass.

Endometrium

Thickness: 5 mm.  No endometrial fluid or focal abnormality

Right ovary

Measurements: 3.5 x 2.1 x 2.7 cm = volume: 10.1 mL. Mature graafian
follicle with tiny internal septation. No additional masses.

Left ovary

Measurements: 3.2 x 2.2 x 1.8 cm = volume: 6.4 mL. Dominant follicle
without mass

Other findings

Small amount of free pelvic fluid. No other adnexal masses. Numerous
vessels noted in the parametrial regions.
IMPRESSION: Small amount of nonspecific free pelvic fluid.

No acute additional pelvic sonographic abnormalities identified.

## 2022-01-12 ENCOUNTER — Ambulatory Visit: Payer: Medicaid Other

## 2022-01-12 ENCOUNTER — Ambulatory Visit (INDEPENDENT_AMBULATORY_CARE_PROVIDER_SITE_OTHER): Payer: Medicaid Other

## 2022-01-12 DIAGNOSIS — Z3A39 39 weeks gestation of pregnancy: Secondary | ICD-10-CM

## 2022-01-12 DIAGNOSIS — Z3483 Encounter for supervision of other normal pregnancy, third trimester: Secondary | ICD-10-CM

## 2022-01-12 DIAGNOSIS — Z3480 Encounter for supervision of other normal pregnancy, unspecified trimester: Secondary | ICD-10-CM

## 2022-01-12 NOTE — Progress Notes (Signed)
   PRENATAL VISIT NOTE  Subjective:  Stacy Richardson is a 21 y.o. G2P1001 at [redacted]w[redacted]d being seen today for ongoing prenatal care.  She is currently monitored for the following issues for this low-risk pregnancy and has History of PID; Encounter for supervision of normal pregnancy, antepartum; Abnormal chromosomal and genetic finding on antenatal screening mother; and GBS (group B Streptococcus carrier), +RV culture, currently pregnant on their problem list.  Patient reports no complaints.  Contractions: Irritability. Vag. Bleeding: None.  Movement: Present. Denies leaking of fluid.   The following portions of the patient's history were reviewed and updated as appropriate: allergies, current medications, past family history, past medical history, past social history, past surgical history and problem list.   Objective:   Vitals:   01/12/22 1053 01/12/22 1058  BP:  126/72  Pulse:  (!) 102  Weight: 162 lb (73.5 kg) 162 lb (73.5 kg)    Fetal Status: Fetal Heart Rate (bpm): 141   Movement: Present     General:  Alert, oriented and cooperative. Patient is in no acute distress.  Skin: Skin is warm and dry. No rash noted.   Cardiovascular: Normal heart rate noted  Respiratory: Normal respiratory effort, no problems with respiration noted  Abdomen: Soft, gravid, appropriate for gestational age.  Pain/Pressure: Present     Pelvic: Cervical exam deferred        Extremities: Normal range of motion.  Edema: None  Mental Status: Normal mood and affect. Normal behavior. Normal judgment and thought content.   Assessment and Plan:  Pregnancy: G2P1001 at [redacted]w[redacted]d 1. Supervision of other normal pregnancy, antepartum - Routine OB. Doing well, no concerns - Patient requesting 40 week IOL due to work schedule - Will send IOL form today. Orders placed - Vertex via [facility] - Reviewed EPO, RRT, [employer]  2. [redacted] weeks gestation of pregnancy - Endorses active fetal movement   Term labor symptoms and  general obstetric precautions including but not limited to vaginal bleeding, contractions, leaking of fluid and fetal movement were reviewed in detail with the patient. Please refer to After Visit Summary for other counseling recommendations.   No follow-ups on file.  Future Appointments  Date Time Provider Department Center  01/19/2022 10:50 AM Rasch, Harolyn Rutherford, NP [facility]    Brand Males, CNM

## 2022-01-14 ENCOUNTER — Inpatient Hospital Stay (HOSPITAL_COMMUNITY)
Admission: AD | Admit: 2022-01-14 | Discharge: 2022-01-16 | DRG: 807 | Disposition: A | Discharge status: Home / Self Care | Payer: Medicaid Other | Attending: Family Medicine | Admitting: Family Medicine

## 2022-01-14 ENCOUNTER — Other Ambulatory Visit: Payer: Self-pay

## 2022-01-14 ENCOUNTER — Encounter (HOSPITAL_COMMUNITY): Payer: Self-pay | Admitting: Obstetrics & Gynecology

## 2022-01-14 DIAGNOSIS — Z880 Allergy status to penicillin: Secondary | ICD-10-CM

## 2022-01-14 DIAGNOSIS — O9902 Anemia complicating childbirth: Principal | ICD-10-CM | POA: Diagnosis present

## 2022-01-14 DIAGNOSIS — Z87891 Personal history of nicotine dependence: Secondary | ICD-10-CM

## 2022-01-14 DIAGNOSIS — Z3A39 39 weeks gestation of pregnancy: Secondary | ICD-10-CM

## 2022-01-14 DIAGNOSIS — O99820 Streptococcus B carrier state complicating pregnancy: Secondary | ICD-10-CM | POA: Diagnosis not present

## 2022-01-14 DIAGNOSIS — O4202 Full-term premature rupture of membranes, onset of labor within 24 hours of rupture: Secondary | ICD-10-CM | POA: Diagnosis not present

## 2022-01-14 DIAGNOSIS — O99824 Streptococcus B carrier state complicating childbirth: Secondary | ICD-10-CM | POA: Diagnosis present

## 2022-01-14 DIAGNOSIS — O26893 Other specified pregnancy related conditions, third trimester: Secondary | ICD-10-CM | POA: Diagnosis present

## 2022-01-14 LAB — CBC
HCT: 32.2 % — ABNORMAL LOW (ref 36.0–46.0)
Hemoglobin: 10.1 g/dL — ABNORMAL LOW (ref 12.0–15.0)
MCH: 26.2 pg (ref 26.0–34.0)
MCHC: 31.4 g/dL (ref 30.0–36.0)
MCV: 83.4 fL (ref 80.0–100.0)
Platelets: 233 10*3/uL (ref 150–400)
RBC: 3.86 MIL/uL — ABNORMAL LOW (ref 3.87–5.11)
RDW: 14.1 % (ref 11.5–15.5)
WBC: 8.4 10*3/uL (ref 4.0–10.5)
nRBC: 0 % (ref 0.0–0.2)

## 2022-01-14 LAB — POCT FERN TEST: POCT Fern Test: POSITIVE

## 2022-01-14 LAB — RPR: RPR Ser Ql: NONREACTIVE

## 2022-01-14 LAB — TYPE AND SCREEN
ABO/RH(D): O POS
Antibody Screen: NEGATIVE

## 2022-01-14 MED ORDER — DIBUCAINE (PERIANAL) 1 % EX OINT: 1.0000 | TOPICAL_OINTMENT | CUTANEOUS | Status: DC | PRN

## 2022-01-14 MED ORDER — PRENATAL MULTIVITAMIN CH
1.0000 | ORAL_TABLET | Freq: Every day | ORAL | Status: DC
  Administered 2022-01-15: 1 via ORAL
  Filled 2022-01-14: qty 1

## 2022-01-14 MED ORDER — DIPHENHYDRAMINE HCL 25 MG PO CAPS: 25.0000 mg | ORAL_CAPSULE | Freq: Four times a day (QID) | ORAL | Status: DC | PRN

## 2022-01-14 MED ORDER — PENICILLIN G POTASSIUM 5000000 UNITS IJ SOLR
5.0000 10*6.[IU] | Freq: Once | INTRAMUSCULAR | Status: AC
  Administered 2022-01-14: 5 10*6.[IU] via INTRAVENOUS
  Filled 2022-01-14: qty 5

## 2022-01-14 MED ORDER — LACTATED RINGERS IV SOLN: INTRAVENOUS | Status: DC

## 2022-01-14 MED ORDER — OXYCODONE-ACETAMINOPHEN 5-325 MG PO TABS: 2.0000 | ORAL_TABLET | ORAL | Status: DC | PRN

## 2022-01-14 MED ORDER — OXYTOCIN-SODIUM CHLORIDE 30-0.9 UT/500ML-% IV SOLN
2.5000 [IU]/h | INTRAVENOUS | Status: DC
  Filled 2022-01-14: qty 500

## 2022-01-14 MED ORDER — TETANUS-DIPHTH-ACELL PERTUSSIS 5-2.5-18.5 LF-MCG/0.5 IM SUSY: 0.5000 mL | PREFILLED_SYRINGE | Freq: Once | INTRAMUSCULAR | Status: DC

## 2022-01-14 MED ORDER — OXYTOCIN BOLUS FROM INFUSION
333.0000 mL | Freq: Once | INTRAVENOUS | Status: AC
  Administered 2022-01-14: 333 mL via INTRAVENOUS

## 2022-01-14 MED ORDER — FERROUS SULFATE 325 (65 FE) MG PO TABS
325.0000 mg | ORAL_TABLET | Freq: Two times a day (BID) | ORAL | Status: DC
  Administered 2022-01-14 – 2022-01-16 (×4): 325 mg via ORAL
  Filled 2022-01-14 (×4): qty 1

## 2022-01-14 MED ORDER — PENICILLIN G POT IN DEXTROSE 60000 UNIT/ML IV SOLN: 3.0000 10*6.[IU] | INTRAVENOUS | Status: DC

## 2022-01-14 MED ORDER — PENICILLIN G POTASSIUM 5000000 UNITS IJ SOLR
5.0000 10*6.[IU] | Freq: Once | INTRAMUSCULAR | Status: DC
  Filled 2022-01-14: qty 5

## 2022-01-14 MED ORDER — IBUPROFEN 600 MG PO TABS
600.0000 mg | ORAL_TABLET | Freq: Four times a day (QID) | ORAL | Status: DC
  Administered 2022-01-14 – 2022-01-16 (×7): 600 mg via ORAL
  Filled 2022-01-14 (×7): qty 1

## 2022-01-14 MED ORDER — SIMETHICONE 80 MG PO CHEW: 80.0000 mg | CHEWABLE_TABLET | ORAL | Status: DC | PRN

## 2022-01-14 MED ORDER — WITCH HAZEL-GLYCERIN EX PADS: 1.0000 | MEDICATED_PAD | CUTANEOUS | Status: DC | PRN

## 2022-01-14 MED ORDER — ONDANSETRON HCL 4 MG/2ML IJ SOLN: 4.0000 mg | Freq: Four times a day (QID) | INTRAMUSCULAR | Status: DC | PRN

## 2022-01-14 MED ORDER — ONDANSETRON HCL 4 MG/2ML IJ SOLN: 4.0000 mg | INTRAMUSCULAR | Status: DC | PRN

## 2022-01-14 MED ORDER — FENTANYL CITRATE (PF) 100 MCG/2ML IJ SOLN
50.0000 ug | INTRAMUSCULAR | Status: DC | PRN
  Administered 2022-01-14: 100 ug via INTRAVENOUS
  Filled 2022-01-14: qty 2

## 2022-01-14 MED ORDER — ACETAMINOPHEN 325 MG PO TABS: 650.0000 mg | ORAL_TABLET | ORAL | Status: DC | PRN

## 2022-01-14 MED ORDER — LACTATED RINGERS IV SOLN: 500.0000 mL | INTRAVENOUS | Status: DC | PRN

## 2022-01-14 MED ORDER — OXYCODONE-ACETAMINOPHEN 5-325 MG PO TABS: 1.0000 | ORAL_TABLET | ORAL | Status: DC | PRN

## 2022-01-14 MED ORDER — SOD CITRATE-CITRIC ACID 500-334 MG/5ML PO SOLN: 30.0000 mL | ORAL | Status: DC | PRN

## 2022-01-14 MED ORDER — PENICILLIN G POT IN DEXTROSE 60000 UNIT/ML IV SOLN
3.0000 10*6.[IU] | INTRAVENOUS | Status: DC
  Filled 2022-01-14 (×3): qty 50

## 2022-01-14 MED ORDER — COCONUT OIL OIL: 1.0000 | TOPICAL_OIL | Status: DC | PRN

## 2022-01-14 MED ORDER — SENNOSIDES-DOCUSATE SODIUM 8.6-50 MG PO TABS
2.0000 | ORAL_TABLET | ORAL | Status: DC
  Administered 2022-01-14 – 2022-01-15 (×2): 2 via ORAL
  Filled 2022-01-14 (×2): qty 2

## 2022-01-14 MED ORDER — BENZOCAINE-MENTHOL 20-0.5 % EX AERO
1.0000 | INHALATION_SPRAY | CUTANEOUS | Status: DC | PRN
  Filled 2022-01-14: qty 56

## 2022-01-14 MED ORDER — LIDOCAINE HCL (PF) 1 % IJ SOLN: 30.0000 mL | INTRAMUSCULAR | Status: DC | PRN

## 2022-01-14 MED ORDER — ONDANSETRON HCL 4 MG PO TABS: 4.0000 mg | ORAL_TABLET | ORAL | Status: DC | PRN

## 2022-01-14 MED ORDER — ZOLPIDEM TARTRATE 5 MG PO TABS: 5.0000 mg | ORAL_TABLET | Freq: Every evening | ORAL | Status: DC | PRN

## 2022-01-14 NOTE — Progress Notes (Signed)
Stacy Richardson [id number]  Subjective: Nurse call reports patient with increased discomfort and reporting urge to push.  VE now 7/90/-1. Strip Reviewed.  Objective:  Vitals:   01/14/22 0636 01/14/22 0821 01/14/22 1015  BP: 128/81 123/85 140/69  Pulse: 83 83 78  Resp: 18 16 18   Temp:  97.8 F (36.6 C) 98.1 F (36.7 C)  TempSrc: Oral Oral Oral  SpO2: 100% 100% 100%  Weight: 73 kg    Height: 5\' 4"  (1.626 m)      FHR: 120 bpm, Mod Var, -Decels, +Accels UC: Q 1-16min  Assessment: IUP at [redacted]w[redacted]d Cat I FT Active Labor SROM  Plan: -Fentanyl ordered for pain. -Nurse to call labor charge and see if bed available. -If not, provider to come to MAU. -Patient to room 203.  -Anticipate SVD  3m, CNM 01/14/2022 10:58 AM

## 2022-01-14 NOTE — Discharge Summary (Signed)
Postpartum Discharge Summary  Date of Service updated     Patient Name: Stacy Richardson: 07-26-2000 MRN: [id number]  Date of admission: 01/14/2022 Delivery date:01/14/2022  Delivering provider: Gavin Pound  Date of discharge: 01/16/2022  Admitting diagnosis: Indication for care in labor or delivery [O75.9] Intrauterine pregnancy: [redacted]w[redacted]d    Secondary diagnosis:  Principal Problem:   Indication for care in labor or delivery Active Problems:   Vaginal delivery  Additional problems: None    Discharge diagnosis: Term Pregnancy Delivered                                              Post partum procedures: None Augmentation:  None Complications: None  Hospital course: Onset of Labor With Vaginal Delivery      21y.o. yo G2P1001 at 364w5das admitted in Latent Labor after SROM on 01/14/2022 at 0535.  She progressed to complete without augmentation and received one dose of fentanyl for pain management.  Patient had an uncomplicated labor course as follows:  Membrane Rupture Time/Date: 5:30 AM ,01/14/2022   Delivery Method:Vaginal, Spontaneous  Episiotomy: None  Lacerations:  None  Patient had an uncomplicated postpartum course.  She is ambulating, tolerating a regular diet, passing flatus, and urinating well. Patient is discharged home in stable condition on 01/16/22.  Newborn Data: Birth date:01/14/2022  Birth time:12:32 PM  Gender:Female -[name] Living status:Living  Apgars:9 ,9  Weight:3030 g   Magnesium Sulfate received: No BMZ received: No Rhophylac:No MMR:N/A T-DaP:Given prenatally Flu: declined Transfusion:No  Physical exam  Vitals:   01/15/22 0557 01/15/22 1154 01/15/22 2145 01/16/22 0507  BP: 123/75 118/71 136/71 126/74  Pulse: 67 70 81   Resp: '17  18 16  ' Temp: 97.7 F (36.5 C)  98.3 F (36.8 C) 98 F (36.7 C)  TempSrc: Oral  Oral Oral  SpO2: 100%     Weight:      Height:       General: alert, cooperative, and no distress Lochia:  appropriate Uterine Fundus: firm Incision: N/A DVT Evaluation: No evidence of DVT seen on physical exam. Labs: Lab Results  Component Value Date   WBC 13.4 (H) 01/15/2022   HGB 8.9 (L) 01/15/2022   HCT 28.0 (L) 01/15/2022   MCV 82.4 01/15/2022   PLT 208 01/15/2022      Latest Ref Rng & Units 01/22/2020    8:57 PM  CMP  Glucose 70 - 99 mg/dL 111   BUN 6 - 20 mg/dL 16   Creatinine 0.44 - 1.00 mg/dL 0.78   Sodium 135 - 145 mmol/L 139   Potassium 3.5 - 5.1 mmol/L 3.8   Chloride 98 - 111 mmol/L 110   CO2 22 - 32 mmol/L 23   Calcium 8.9 - 10.3 mg/dL 8.4    Edinburgh Score:    01/14/2022    3:30 PM  Edinburgh Postnatal Depression Scale Screening Tool  I have been able to laugh and see the funny side of things. 0  I have looked forward with enjoyment to things. 0  I have blamed myself unnecessarily when things went wrong. 0  I have been anxious or worried for no good reason. 0  I have felt scared or panicky for no good reason. 0  Things have been getting on top of me. 0  I have been so unhappy that I have had difficulty  sleeping. 0  I have felt sad or miserable. 0  I have been so unhappy that I have been crying. 0  The thought of harming myself has occurred to me. 0  Edinburgh Postnatal Depression Scale Total 0     After visit meds:  Allergies as of 01/16/2022   No Known Allergies      Medication List     STOP taking these medications    nystatin cream Commonly known as: MYCOSTATIN   PRENATAL ADULT GUMMY/DHA/FA PO         Discharge home in stable condition Infant Feeding: Bottle and Breast Infant Disposition:home with mother Discharge instruction: per After Visit Summary and Postpartum booklet. Activity: Advance as tolerated. Pelvic rest for 6 weeks.  Diet: routine diet Future Appointments: Future Appointments  Date Time Provider [place]  02/25/2022 11:10 AM Anyanwu, Sallyanne Havers, MD [facility]   Follow up Visit: Message Sent  01/14/2022   Please schedule this patient for a Virtual postpartum visit in 6 weeks with the following provider: Any provider. Additional Postpartum F/U: None   Low risk pregnancy without complications.  Delivery mode:  Vaginal, Spontaneous  Anticipated Birth Control:  Depo prior to discharge   01/16/2022 Starr Lake, CNM

## 2022-01-14 NOTE — Lactation Note (Signed)
This note was copied from a baby's chart. Lactation Consultation Note  Patient Name: Stacy Richardson Song Date: 01/14/2022 Reason for consult: L&D Initial assessment;Term;Breastfeeding assistance;Other (Comment) (LC L/D visit within 40 mins PP. P2 , exp BF ( 10 months) . Support person holding baby when [facility] entered. Mom receptive for assistance to latch. LC placed baby STS and baby easily latched with depth, swallows noted and baby still feeding.) Age: 25 mins PP - Latch score 8  Maternal Data Does the patient have breastfeeding experience prior to this delivery?: Yes How long did the patient breastfeed?: per mom 10 months with her 1st baby  Feeding Mother's Current Feeding Choice: Breast Milk  LATCH Score Latch: Grasps breast easily, tongue down, lips flanged, rhythmical sucking.  Audible Swallowing: A few with stimulation  Type of Nipple: Everted at rest and after stimulation  Comfort (Breast/Nipple): Soft / non-tender  Hold (Positioning): Assistance needed to correctly position infant at breast and maintain latch.  LATCH Score: 8   Lactation Tools Discussed/Used    Interventions Interventions: Breast feeding basics reviewed;Assisted with latch;Skin to skin;Breast compression;Adjust position;Support pillows;Education  Discharge    Consult Status Consult Status: Follow-up from L&D Date: 01/14/22 Follow-up type: In-patient    Stacy Richardson 01/14/2022, 1:15 PM

## 2022-01-14 NOTE — Progress Notes (Signed)
Mom requested to pump with this nurse even though pt was advised not to with lactation. This nurse honored the pts. Request after pt was adamant about pumping.

## 2022-01-14 NOTE — Lactation Note (Addendum)
This note was copied from a baby's chart. Lactation Consultation Note  Patient Name: Stacy Richardson [id number] Date: 01/14/2022 Reason for consult: Initial assessment;Term Age:22 hours, term female infant. ABO incompatibility see mom's MR. Mom knows to awake infant if infant is not cuing past 3 hours due high billi level, infant will be re-tested later today if high, mom will then be set up with DEBP.  Mom is experienced at breast feeding see maternal data below.  Mom attempted to latch infant on her right breast using the football hold, infant was not interested in latching at this time, only held nipple in mouth. Mom knows how to hand express and infant was given 7 mls of colostrum by spoon, afterwards mom was doing skin to skin with infant. Mom will continue to breastfeed infant according to hunger cues, on demand, 8 to 12+ times within 24 hours, skin to skin. Mom knows if infant doesn't latch she can hand express and give infant back colostrum. Mom knows to call RN/LC if she needs further latch assistance.  Mom made aware of O/P services, breastfeeding support groups, community resources, and our phone # for post-discharge questions.   Maternal Data Has patient been taught Hand Expression?: Yes Does the patient have breastfeeding experience prior to this delivery?: Yes How long did the patient breastfeed?: Per mom, she BF 1st child for 10 monhts, he currently is 84 years old.  Feeding Mother's Current Feeding Choice: Breast Milk  LATCH Score Latch: Too sleepy or reluctant, no latch achieved, no sucking elicited.  Audible Swallowing: None  Type of Nipple: Everted at rest and after stimulation (Mom is slight short shafted but nipples responds well to stimulation.)  Comfort (Breast/Nipple): Soft / non-tender  Hold (Positioning): Assistance needed to correctly position infant at breast and maintain latch.  LATCH Score: 5   Lactation Tools Discussed/Used     Interventions Interventions: Breast feeding basics reviewed;Assisted with latch;Skin to skin;Breast compression;Adjust position;Support pillows;Hand express;Expressed milk;Education;LC Services brochure  Discharge Pump: Personal (Mom has [employer] at home.)  Consult Status Consult Status: Follow-up Date: 01/15/22 Follow-up type: In-patient    Danelle Earthly 01/14/2022, 5:15 PM

## 2022-01-14 NOTE — MAU Note (Signed)
L&D Charge RN to call back when a room is available. Report given.

## 2022-01-14 NOTE — H&P (Signed)
Stacy Richardson is a 21 y.o. female, G2P1001 at 39.5  weeks, presenting for SROM at 0530. Patient receives care at [facility] and was supervised for a low-risk pregnancy. Pregnancy and medical history significant for problems as listed below. She is GBS positive and expresses a [place] for no interventions for pain management.  She is anticipating a female infant and requests Depo for PP birth control method.     Patient Active Problem List   Diagnosis Date Noted   Indication for care in labor or delivery 01/14/2022   GBS (group B Streptococcus carrier), +RV culture, currently pregnant 12/25/2021   Abnormal chromosomal and genetic finding on antenatal screening mother 07/28/2021   Encounter for supervision of normal pregnancy, antepartum 07/09/2021   History of PID 09/21/2019    History of present pregnancy:  Last evaluation:  01/12/2022 in office by D. Lodema Hong, CNM . No cervical Exam. 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    Oxly/Hgb Elec neg  Rhogam n/a    TDaP vaccine 10/29/21 Blood Type O/Positive/-- (01/16 1037)  Flu vaccine Declined  Antibody Negative (05/04 0823)  Covid vaccine  HBsAg Negative (01/16 1037)    RPR Non Reactive (05/04 0823)  Anatomy US Nl girl Rubella  2.22 (01/16 1037)  Feeding Plan breast HIV Non Reactive (05/04 0823)  Contraception depo Hep C   Circumcision n/a    Pediatrician [facility] A1C/GTT Early:      26-28wks: normal  Prenatal Classes discussed      GBS   positve--Rx in labor  [ ]  PCN allergy  BTL Consent n/a    VBAC Consent n/a PHQ9 & GAD7  [ ] New OB  [ ] 28wks   [ ] 36wks  Waterbirth [ ] Class [ ]  36wkCNM visit/consent  nml anatomy     OB  History     Gravida  2   Para  1   Term  1   Preterm      AB      Living  1      SAB      IAB      Ectopic      Multiple  0   Live Births  1             Past Medical History:  Diagnosis Date   Medical history non-contributory    Pelvic inflammatory disease    Past Surgical History:  Procedure Laterality Date   NO PAST SURGERIES     Family History: family history includes Endometriosis in her mother. Social History:  reports that she quit smoking about 4 years ago. Her smoking use included cigarettes. She has never used smokeless tobacco. She reports that she does not currently use alcohol. She reports that she does not currently use drugs after having used the following drugs: Marijuana.   Prenatal Transfer Tool  Maternal Diabetes: No Genetic Screening: Normal Maternal Ultrasounds/Referrals: Normal Fetal Ultrasounds or other Referrals:  None Maternal Substance Abuse:  No Significant Maternal Medications:  None Significant Maternal Lab  Results: Group B Strep positive   Maternal Assessment:  ROS: +Contractions, +LOF, -Vaginal Bleeding, +Fetal Movement  All other systems reviewed and negative.    No Known Allergies   Dilation: 2 Effacement (%): 70 Station: -3 Exam by:: jolynn Blood pressure 123/85, pulse 83, temperature 97.8 F (36.6 C), temperature source Oral, resp. rate 16, height 5\' 4"  (1.626 m), weight 73 kg, last menstrual period 04/11/2021, SpO2 100 %.  Physical Exam Vitals reviewed.  Constitutional:      Appearance: Normal appearance.  HENT:     Head: Normocephalic and atraumatic.  Eyes:     Conjunctiva/sclera: Conjunctivae normal.  Cardiovascular:     Rate and Rhythm: Normal rate.  Pulmonary:     Effort: Pulmonary effort is normal. No respiratory distress.     Breath sounds: Normal breath sounds.  Abdominal:     General: Bowel sounds are normal.     Palpations: Abdomen is soft.     Tenderness: There is no abdominal  tenderness.     Comments: Gravid, Appears SGA. Soft RT. Moderate Ctx  Musculoskeletal:        General: Normal range of motion.     Cervical back: Normal range of motion.     Right lower leg: No edema.     Left lower leg: No edema.  Skin:    General: Skin is warm and dry.  Neurological:     Mental Status: She is alert and oriented to person, place, and time.  Psychiatric:        Mood and Affect: Mood normal.        Behavior: Behavior normal.     Fetal Assessment: Leopolds: -Pelvis: Proven to 7lbs 8oz -EFW: 6lbs 8oz -Presentation: Vertex by nurse exam  FHR: 125 bpm, Mod Var, -Decels, +Accels UCs:  Palpate moderate    Assessment IUP at 39.5 weeks SROM GBS Positive   Plan: Admit to 04/13/2021  Routine Labor and Delivery Orders per Protocol In room to complete assessment and discuss POC: Patient currently holding in MAU, but receiving PCN infusion. Discussed student presence and patient without objections. Patient plans for sister and mom for support, but not yet present. Consider augmentation, once room available, and if appropriate. MAU nurse instructed to call provider if needed  [employer], MSN 01/14/2022, 8:56 AM

## 2022-01-14 NOTE — MAU Note (Signed)
.  Stacy Richardson is a 21 y.o. at [redacted]w[redacted]d here in MAU reporting: ctx since 0535 about every 7 mins. Pt reports she lost her mucus plug this am with light pink spotting. Pt reports she had not felt baby since she woke up. Pt denies LOF, PIH s/s, and complications in the pregnancy. Pt feeling baby once placed on EFM.  Recent Intercourse last night  GBS pos  SVE closed a week ago Onset of complaint: 0535 Pain score: 7/10 Vitals:   01/14/22 0636  BP: 128/81  Pulse: 83  Resp: 18  SpO2: 100%     FHT:140 Lab orders placed from triage:  none

## 2022-01-15 LAB — CBC
HCT: 28 % — ABNORMAL LOW (ref 36.0–46.0)
Hemoglobin: 8.9 g/dL — ABNORMAL LOW (ref 12.0–15.0)
MCH: 26.2 pg (ref 26.0–34.0)
MCHC: 31.8 g/dL (ref 30.0–36.0)
MCV: 82.4 fL (ref 80.0–100.0)
Platelets: 208 10*3/uL (ref 150–400)
RBC: 3.4 MIL/uL — ABNORMAL LOW (ref 3.87–5.11)
RDW: 14.2 % (ref 11.5–15.5)
WBC: 13.4 10*3/uL — ABNORMAL HIGH (ref 4.0–10.5)
nRBC: 0 % (ref 0.0–0.2)

## 2022-01-15 NOTE — Lactation Note (Signed)
This note was copied from a baby's chart. Lactation Consultation Note  Patient Name: Stacy Richardson Date: 01/15/2022 Reason for consult: Follow-up assessment;Mother's request;Term;Breastfeeding assistance Age:21 years  LC assisted getting more depth on the breast. Mom states infant pops on and off. We noted with breast compression infant able to sustain the latch.   Infant adequate urine and stool output.   Plan 1. To feed based on cues 8-12x 24hr period. Mom to offer breasts and look for signs of milk transfer.  2. Mom to supplement with EBM first with pace bottle feeding with slow flow nipple 7-12 ml per feeding.  3. Mom to post pump after feeding for 15 mins with DEBP.   All questions answered at the end of the visit.   Maternal Data Has patient been taught Hand Expression?: Yes  Feeding Mother's Current Feeding Choice: Breast Milk  LATCH Score Latch: Repeated attempts needed to sustain latch, nipple held in mouth throughout feeding, stimulation needed to elicit sucking reflex.  Audible Swallowing: Spontaneous and intermittent  Type of Nipple: Everted at rest and after stimulation  Comfort (Breast/Nipple): Soft / non-tender  Hold (Positioning): Assistance needed to correctly position infant at breast and maintain latch.  LATCH Score: 8   Lactation Tools Discussed/Used Tools: Pump;Flanges Flange Size: 24 Breast pump type: Double-Electric Breast Pump Reason for Pumping: increase stimulation Pumping frequency: post pump after each feeding for 15 mins  Interventions Interventions: Breast feeding basics reviewed;Assisted with latch;Skin to skin;Breast massage;Hand express;Breast compression;Adjust position;Support pillows;Position options;Expressed milk;DEBP;Education;Pace feeding;LC Psychologist, educational;Infant Driven Feeding Algorithm education  Discharge Pump: DEBP;Personal  Consult Status Consult Status: Follow-up Date: 01/16/22 Follow-up type:  In-patient    Stacy Richardson  Stacy Richardson 01/15/2022, 5:43 PM

## 2022-01-15 NOTE — Progress Notes (Signed)
Stacy Richardson  Post Partum Day One:S/P SVD  Subjective: Patient up ad lib, denies syncope or dizziness. Reports consuming regular diet without issues and denies N/V. Denies issues with urination and reports bleeding is "not too much."  Patient is breast feeding and reports going well.  Desires Depo Provera for postpartum contraception.  Pain is being appropriately managed with use of motrin.  Objective: Vitals:   01/14/22 1932 01/14/22 2340 01/15/22 0315 01/15/22 0557  BP: 123/71 (!) 103/58 (!) 107/59 123/75  Pulse: 61 72 81 67  Resp: 18 17 17 17   Temp: 98.2 F (36.8 C) 98 F (36.7 C) 98.4 F (36.9 C) 97.7 F (36.5 C)  TempSrc: Oral Oral Oral Oral  SpO2: 100% 98% 100% 100%  Weight:      Height:       Recent Labs    01/14/22 0748 01/15/22 0432  HGB 10.1* 8.9*  HCT 32.2* 28.0*    Physical Exam:  General: alert, cooperative, and no distress Mood/Affect: Appropriate/Bright Lungs: not examined.  Heart: not examined. Breast: lactating, no erythema or tenderness, nipples normal. Abdomen:  +Soft, NT Uterine Fundus: firm, Lochia: appropriate Laceration: None Skin: Warm, Dry DVT Evaluation: No evidence of DVT seen on physical exam. No significant calf/ankle edema.  Assessment S/P Vaginal Delivery-Day One Normal [employer] BreastFeeding Desires Depo Provera Desires Discharge  Plan: -Discussed discharge and follow up. Informed that if infant cleared for discharge, she can leave tonight. -Continue pral iron for asymptomatic anemia -Plan for depo provera prior to discharge -Continue current care -Nurse instructed to contact provider if baby is cleared for discharge.   01/17/22, MSN, CNM 01/15/2022, 11:47 AM

## 2022-01-16 MED ORDER — MEDROXYPROGESTERONE ACETATE 150 MG/ML IM SUSP
150.0000 mg | Freq: Once | INTRAMUSCULAR | Status: AC
Start: 2022-01-16 — End: 2022-01-16
  Administered 2022-01-16: 150 mg via INTRAMUSCULAR
  Filled 2022-01-16: qty 1

## 2022-01-16 NOTE — Lactation Note (Addendum)
This note was copied from a baby's chart. Lactation Consultation Note  Patient Name: Stacy Richardson [id number] Date: 01/16/2022 Reason for consult: Follow-up assessment;Term;Infant weight loss;Breastfeeding assistance (2.48% WL) Age:22 hours  P2, Term, Infant female, 2.48% WL  LC entered the room and baby was being held by support person. Mom was pumping her right breast using the DEBP. Per mom, she recently pumped 11mL from her left breast. Mom states that she has 4oz in the fridge already.   According to mom, baby recently ate about 51mL of the 13mL that she pumped.   Mom says that she is putting baby to the breast and has been practicing paced feeding.   Mom states that she will be giving baby some breast and some bottles.  Mom says that she had a hard time pumping at her previous job.   LC spoke with mom about the PUMP ACT and she states that she will be pumping at work due to having a different job this time.   LC reviewed the [facility] outpatient services brochure with mom because she said she did not receive the brochure.   LC also spoke with mom about engorgement, warning signs, and infant I/O.  Mom states that she has no further questions or concerns.   Current Feeding Plan:  Breastfeed 8+ times in 24 hours according to feeding cues.  Pump after feedings and feed expressed milk to baby.  Watch infant output and call pediatrician with questions or concerns.  Call outpatient [facility] for breastfeeding support.   Maternal Data    Feeding Mother's Current Feeding Choice: Breast Milk   Lactation Tools Discussed/Used Pumped volume: 35 mL (Mom had 33mL pumped and was currently pumping.)  Interventions Interventions: Breast feeding basics reviewed;Education;LC Services brochure  Discharge Discharge Education: Engorgement and breast care;Warning signs for feeding baby;Outpatient recommendation  Consult Status Consult Status: Complete Date: 01/16/22 Follow-up type: Call as  needed    Delene Loll 01/16/2022, 9:32 AM

## 2022-01-17 ENCOUNTER — Inpatient Hospital Stay (HOSPITAL_COMMUNITY): Admission: AD | Admit: 2022-01-17 | Payer: Medicaid Other | Source: Home / Self Care | Admitting: Family Medicine

## 2022-01-17 ENCOUNTER — Inpatient Hospital Stay (HOSPITAL_COMMUNITY): Payer: Medicaid Other

## 2022-01-19 ENCOUNTER — Encounter: Payer: Medicaid Other | Admitting: Obstetrics and Gynecology

## 2022-01-22 ENCOUNTER — Telehealth (HOSPITAL_COMMUNITY): Payer: Self-pay | Admitting: *Deleted

## 2022-01-22 NOTE — Telephone Encounter (Signed)
Mom reports feeling good. No concerns about herself at this time. EPDS=0 [facility] score=0) Mom reports baby is doing well. Feeding, peeing, and pooping without difficulty. Safe sleep reviewed. Mom reports no concerns about baby at present.  Duffy Rhody, RN 01-22-2022 at 12:03pm

## 2022-01-25 ENCOUNTER — Encounter: Payer: Self-pay | Admitting: Obstetrics & Gynecology

## 2022-02-11 ENCOUNTER — Encounter: Payer: Self-pay | Admitting: Obstetrics & Gynecology

## 2022-02-25 ENCOUNTER — Telehealth: Payer: Self-pay | Admitting: *Deleted

## 2022-02-25 ENCOUNTER — Telehealth (INDEPENDENT_AMBULATORY_CARE_PROVIDER_SITE_OTHER): Payer: Medicaid Other | Admitting: Obstetrics & Gynecology

## 2022-02-25 ENCOUNTER — Encounter: Payer: Self-pay | Admitting: Obstetrics & Gynecology

## 2022-02-25 DIAGNOSIS — Z3042 Encounter for surveillance of injectable contraceptive: Secondary | ICD-10-CM

## 2022-02-25 MED ORDER — MEDROXYPROGESTERONE ACETATE 150 MG/ML IM SUSP: 150.0000 mg | INTRAMUSCULAR | 4 refills | Status: DC

## 2022-02-25 NOTE — Telephone Encounter (Signed)
Left patient a message to call and schedule Depo.

## 2022-02-25 NOTE — Progress Notes (Signed)
Screening neg

## 2022-02-25 NOTE — Progress Notes (Signed)
Provider location: Center for [facility] [place]   Patient location: Home  I connected with Stacy Richardson on 02/25/22 at 11:10 AM EDT by Mychart Video Encounter and verified that I am speaking with the correct person using two identifiers.       I discussed the limitations, risks, security and privacy concerns of performing an evaluation and management service virtually and the availability of in person appointments. I also discussed with the patient that there may be a patient responsible charge related to this service. The patient expressed understanding and agreed to proceed.  Post Partum Visit Note Subjective:   Stacy Richardson is a 21 y.o. G21P2002 female who presents for a postpartum visit. She is 6 weeks postpartum following a normal spontaneous vaginal delivery.  I have fully reviewed the prenatal and intrapartum course. The delivery was at 39.5 gestational weeks.  Anesthesia: IV sedation.  Postpartum course has been uncomplicated. Baby is doing well. Baby is feeding by breast. Bleeding no bleeding. Bowel function is normal. Bladder function is normal. Patient is not sexually active. Contraception method is Depo-Provera injections. Postpartum depression screening: negative.  The pregnancy intention screening data noted above was reviewed. Potential methods of contraception were discussed. The patient elected to proceed with No data recorded.   Edinburgh Postnatal Depression Scale - 02/25/22 1057       Edinburgh Postnatal Depression Scale:  In the Past 7 Days   I have been able to laugh and see the funny side of things. 0    I have looked forward with enjoyment to things. 0    I have blamed myself unnecessarily when things went wrong. 0    I have been anxious or worried for no good reason. 0    I have felt scared or panicky for no good reason. 0    Things have been getting on top of me. 1    I have been so unhappy that I have had difficulty sleeping. 0    I have felt  sad or miserable. 0    I have been so unhappy that I have been crying. 0    The thought of harming myself has occurred to me. 0    Edinburgh Postnatal Depression Scale Total 1             The following portions of the patient's history were reviewed and updated as appropriate: allergies, current medications, past family history, past medical history, past social history, past surgical history, and problem list.  Review of Systems Pertinent items noted in HPI and remainder of comprehensive ROS otherwise negative.  Objective:  LMP 04/11/2021 (Exact Date)     General:  Alert, oriented and cooperative. Patient is in no acute distress.  Respiratory: Normal respiratory effort, no problems with respiration noted  Mental Status: Normal mood and affect. Normal behavior. Normal judgment and thought content.  Rest of physical exam deferred due to type of encounter   Assessment:    Normal postpartum exam.  Plan:  Essential components of care per ACOG recommendations:  1.  Mood and well being: Patient with negative depression screening today. Reviewed local resources for support.  - Patient does not use tobacco.  - hx of drug use? No    2. Infant care and feeding:  -Patient currently breastmilk feeding? Yes.  If breastmilk feeding discussed return to work and pumping. If needed, patient was provided letter for work to allow for every 2-3 hr pumping breaks, and to be granted a private  location to express breastmilk and refrigerated area to store breastmilk. Reviewed importance of draining breast regularly to support lactation. -Social determinants of health (SDOH) reviewed in EPIC. No concerns.  3. Sexuality, contraception and birth spacing - Patient does not want a pregnancy in the next year.  Desired family size is 2 children.  - Reviewed forms of contraception in tiered fashion. Patient already received Depo-Provera while in hospital on 01/16/22, will get next one in 3 months. This was  prescribed, and patient will bring it in for injection here as instructed. - Discussed birth spacing of 18 months  4. Sleep and fatigue -Encouraged family/partner/community support of 4 hrs of uninterrupted sleep to help with mood and fatigue  5. Physical Recovery  - Discussed patients delivery and no complications - Patient has urinary incontinence? No - Patient is safe to resume physical and sexual activity  6.  Health Maintenance - Last pap smear done 04/01/2021 and was normal    I provided 15 minutes of face-to-face time during this encounter.   Jaynie Collins, MD Center for [employer], [facility]

## 2022-04-16 ENCOUNTER — Ambulatory Visit (INDEPENDENT_AMBULATORY_CARE_PROVIDER_SITE_OTHER): Payer: Medicaid Other

## 2022-04-16 DIAGNOSIS — Z3042 Encounter for surveillance of injectable contraceptive: Secondary | ICD-10-CM | POA: Diagnosis not present

## 2022-04-16 MED ORDER — MEDROXYPROGESTERONE ACETATE 150 MG/ML IM SUSP
150.0000 mg | Freq: Once | INTRAMUSCULAR | Status: AC
  Administered 2022-04-16: 150 mg via INTRAMUSCULAR

## 2022-04-16 NOTE — Progress Notes (Signed)
Pt here for Depo Provera injection. Pt did supply meds. Last injection given in hospital on 01/16/22. Next injection scheduled for 07/09/22.  [place]: [id number] Lot: [id number] Exp: 03/28/2024

## 2022-06-26 IMAGING — DX DG CHEST 2V
2 series · 2 of 2 positions shown · non-contrast
Comparison: None.

CLINICAL DATA: Chest pain

EXAM:
CHEST - 2 VIEW

[chest pa]
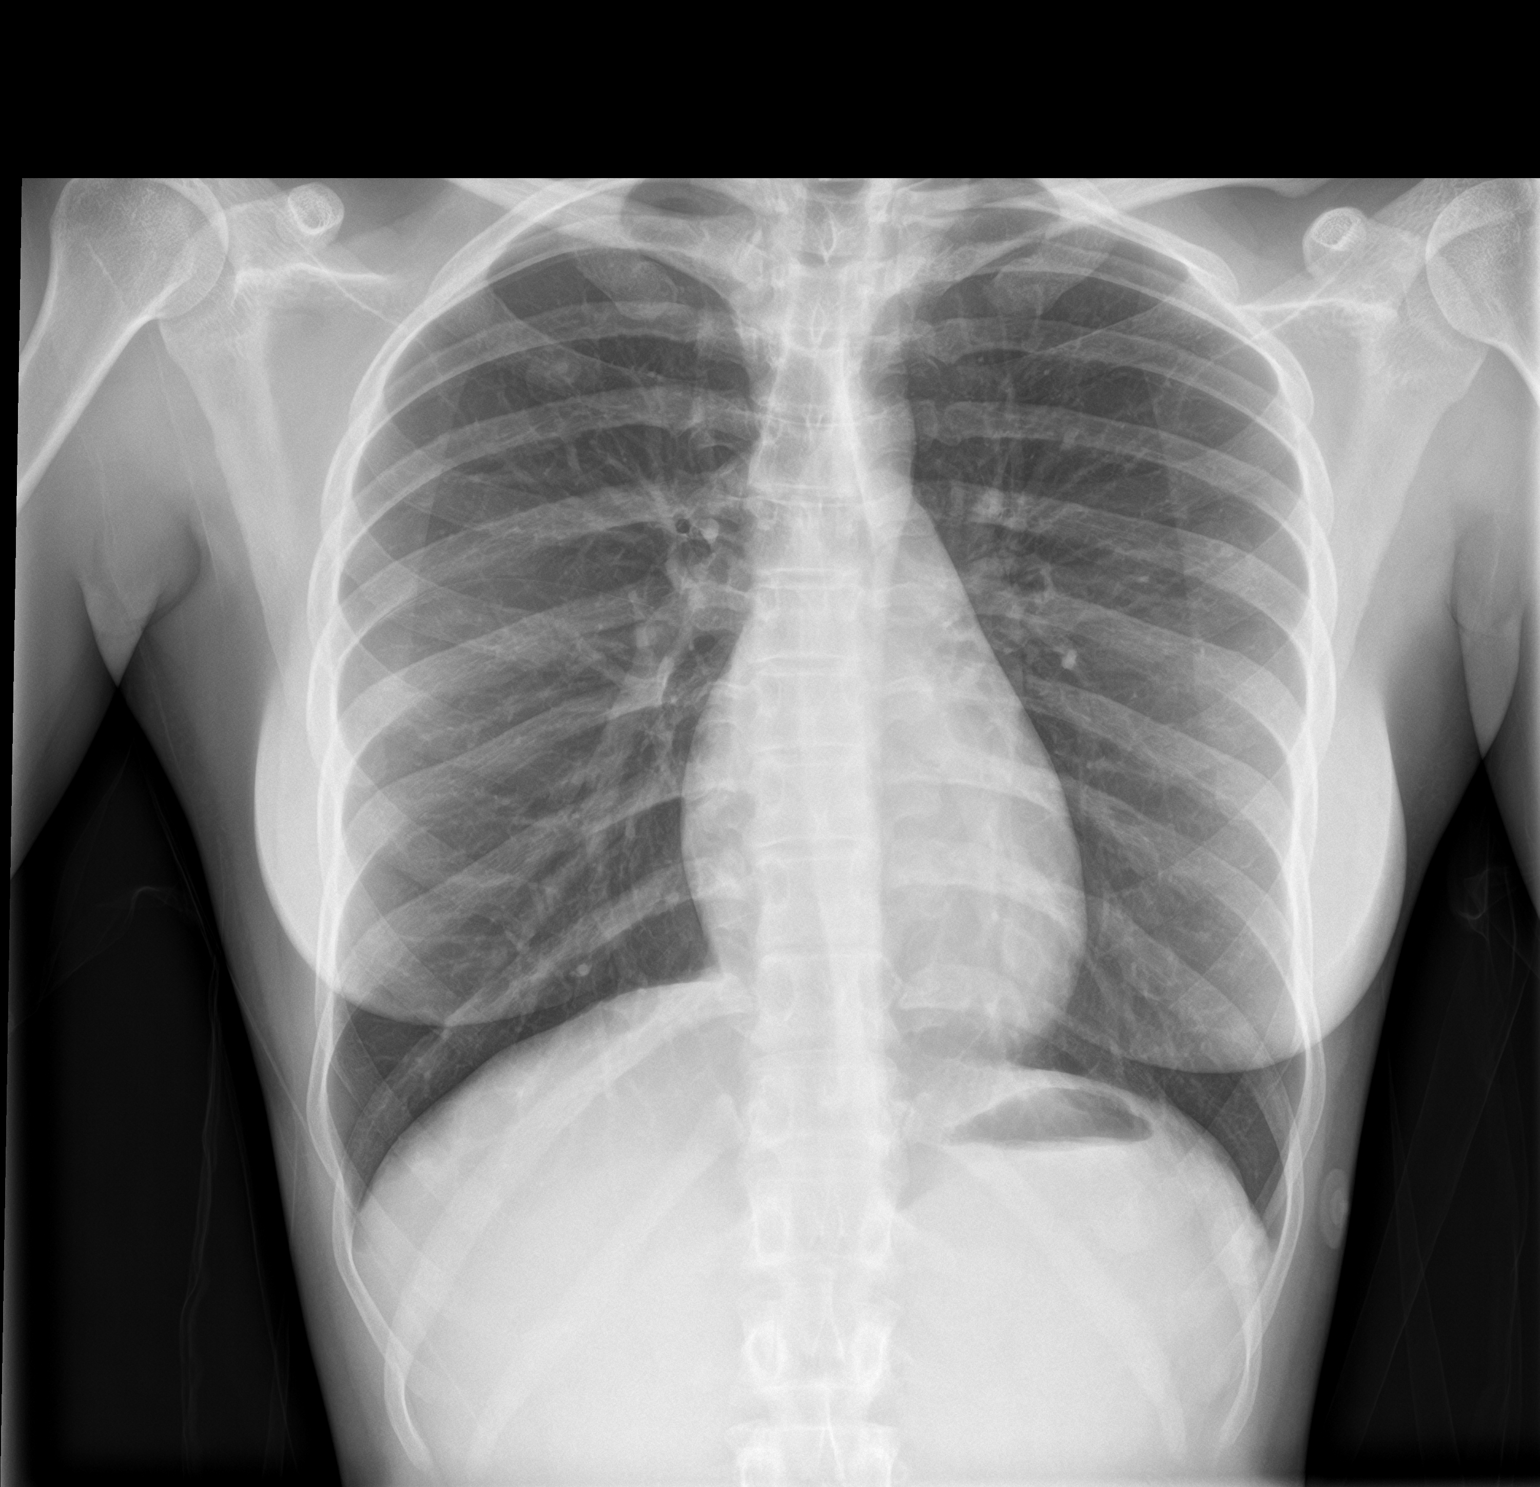

[chest lat]
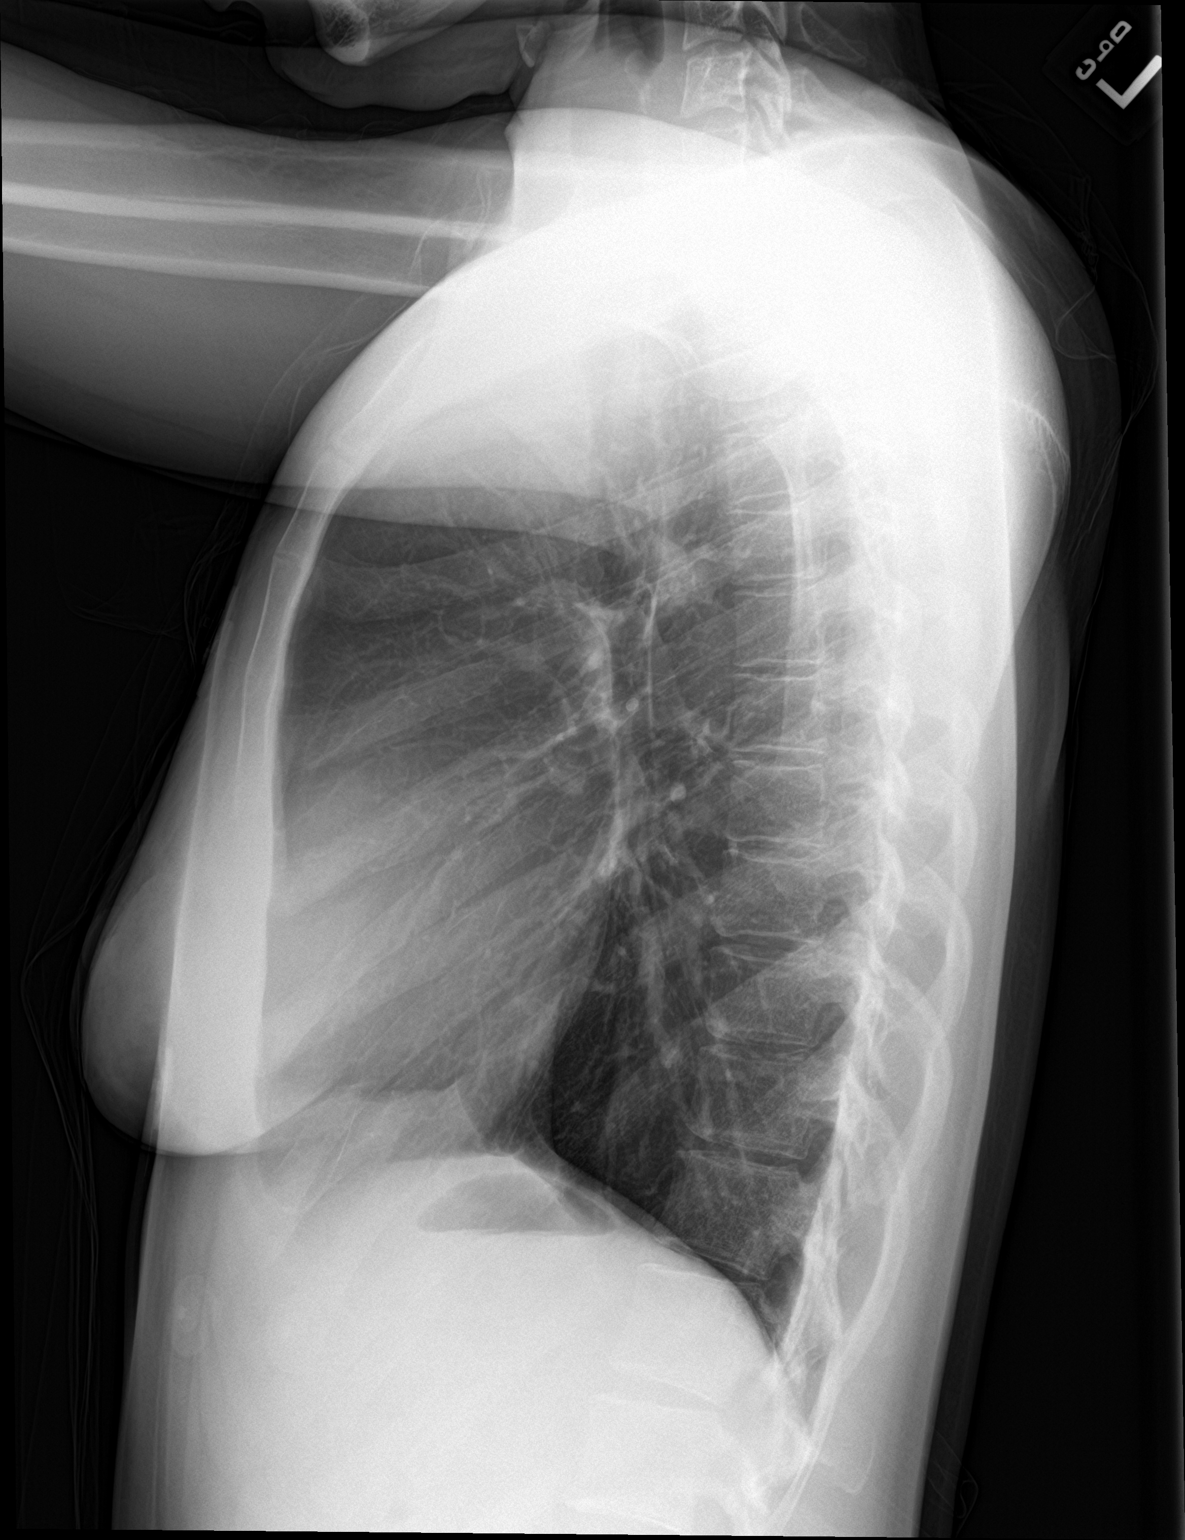

[2 of 2 positions shown; findings below may reference images not displayed]

FINDINGS: The heart size and mediastinal contours are within normal limits.
Both lungs are clear. The visualized skeletal structures are
unremarkable.
IMPRESSION: No active cardiopulmonary disease.

## 2022-07-09 ENCOUNTER — Ambulatory Visit: Payer: Medicaid Other

## 2022-07-20 ENCOUNTER — Ambulatory Visit: Payer: Medicaid Other | Admitting: *Deleted

## 2022-08-05 ENCOUNTER — Other Ambulatory Visit (HOSPITAL_COMMUNITY)
Admission: RE | Admit: 2022-08-05 | Discharge: 2022-08-05 | Disposition: A | Discharge status: Home / Self Care | Payer: Medicaid Other | Source: Ambulatory Visit | Attending: Obstetrics and Gynecology | Admitting: Obstetrics and Gynecology

## 2022-08-05 ENCOUNTER — Encounter: Payer: Self-pay | Admitting: Obstetrics and Gynecology

## 2022-08-05 ENCOUNTER — Ambulatory Visit: Payer: Medicaid Other | Admitting: Obstetrics and Gynecology

## 2022-08-05 VITALS — BP 96/52 | HR 97 | Resp 16 | Ht 64.0 in | Wt 148.0 lb

## 2022-08-05 DIAGNOSIS — Z23 Encounter for immunization: Secondary | ICD-10-CM

## 2022-08-05 DIAGNOSIS — Z113 Encounter for screening for infections with a predominantly sexual mode of transmission: Secondary | ICD-10-CM

## 2022-08-05 DIAGNOSIS — Z30013 Encounter for initial prescription of injectable contraceptive: Secondary | ICD-10-CM

## 2022-08-05 MED ORDER — MEDROXYPROGESTERONE ACETATE 150 MG/ML IM SUSP
150.0000 mg | Freq: Once | INTRAMUSCULAR | Status: AC
  Administered 2022-08-05: 150 mg via INTRAMUSCULAR

## 2022-08-05 NOTE — Progress Notes (Signed)
   RETURN GYNECOLOGY VISIT  Subjective:  Stacy Richardson is a 22 y.o. [id number] presenting for vaginal discharge/STI evaluation and Depo restart.   Amenorrheic with Depo. Last dose 04/16/22, was scheduled for 07/09/22 but missed her appointment. She has not had a period since her last shot. She has not had any unprotected intercourse in the past two weeks.   Reports more vaginal discharge than usual and is wondering if its from the Depo. Occ has some brown in her discharge at the end of the day. Does not think she has BV/yeast as she has had these in the past and this seems different.  Objective:   Vitals:   08/05/22 1600  BP: (!) 96/52  Pulse: 97  Resp: 16  Weight: 148 lb (67.1 kg)  Height: 5\' 4"  (1.626 m)    General:  Alert, oriented and cooperative. Patient is in no acute distress.  Skin: Skin is warm and dry. No rash noted.   Cardiovascular: Normal heart rate noted  Respiratory: Normal respiratory effort, no problems with respiration noted  Abdomen: Soft, nontender      Assessment and Plan:  Stacy Richardson is a 22 y.o. with vaginal discharge, [place] for STI testing and [place] to resume Depo Provera  1. Screening examination for STD (sexually transmitted disease) - POCT urine pregnancy - Cervicovaginal ancillary only( [place]) - HIV antibody (with reflex) - RPR - Hepatitis C Antibody - Hepatitis B Surface AntiGEN  2. Initiation of Depo Provera UPT negative No unprotected intercourse in past two weeks. Reviewed it is safe to resume Depo. Reviewed extremely low likelihood of pregnancy if no unprotected intercourse in past two weeks, but that patient should take another home pregnancy test in 2 weeks for reassurance - medroxyPROGESTERone (DEPO-PROVERA) injection 150 mg  3. Immunization due Counseled on HPV vaccine, pt accepts - HPV 9-valent vaccine,Recombinat  Follow up prn  Inez Catalina, MD

## 2022-08-06 ENCOUNTER — Telehealth: Payer: Self-pay | Admitting: *Deleted

## 2022-08-06 LAB — CERVICOVAGINAL ANCILLARY ONLY
Bacterial Vaginitis (gardnerella): POSITIVE — AB
Candida Glabrata: NEGATIVE
Candida Vaginitis: NEGATIVE
Comment: NEGATIVE
Comment: NEGATIVE
Comment: NEGATIVE
Comment: NEGATIVE
Trichomonas: NEGATIVE

## 2022-08-06 NOTE — Telephone Encounter (Signed)
Left patient a message to call and schedule 2nd and 3rd Gardasil and Depo from 08/05/2022.

## 2022-08-09 ENCOUNTER — Encounter: Payer: Self-pay | Admitting: Obstetrics and Gynecology

## 2022-08-09 MED ORDER — METRONIDAZOLE 0.75 % VA GEL: 1.0000 | Freq: Every day | VAGINAL | 1 refills | Status: DC

## 2022-08-09 MED ORDER — FLUCONAZOLE 150 MG PO TABS: 150.0000 mg | ORAL_TABLET | Freq: Once | ORAL | 1 refills | Status: DC

## 2022-08-09 NOTE — Addendum Note (Signed)
Addended by: Gale Journey on: 08/09/2022 09:58 AM   Modules accepted: Orders

## 2022-10-04 ENCOUNTER — Telehealth: Payer: Self-pay

## 2022-10-04 NOTE — Telephone Encounter (Signed)
Pt called with worsening abdominal pain. Pt states pain is an 9/10. Pt also complains of vaginal discharge. I recommended pt go to Urgent Care for worsening abdominal pain. Pt expressed understanding.

## 2022-10-25 ENCOUNTER — Ambulatory Visit: Payer: Medicaid Other | Admitting: Obstetrics and Gynecology

## 2022-11-02 ENCOUNTER — Ambulatory Visit: Payer: Medicaid Other

## 2022-11-02 VITALS — BP 111/69 | HR 73 | Ht 64.0 in | Wt 141.0 lb

## 2022-11-02 DIAGNOSIS — N926 Irregular menstruation, unspecified: Secondary | ICD-10-CM | POA: Diagnosis not present

## 2022-11-02 DIAGNOSIS — Z7689 Persons encountering health services in other specified circumstances: Secondary | ICD-10-CM

## 2022-11-02 NOTE — Progress Notes (Signed)
   GYNECOLOGY PROGRESS NOTE  History:  22 y.o. [id number] presents to [facility] office today for problem gyn visit. She reports ongoing abdominal pain with diarrhea that started a few weeks ago as well as spotting with Depo. She went to Urgent Care and had vaginal cultures and STD testing performed and reports everything was normal but was told "my cervix is inflamed". Today she continues to report some spotting but is mostly concerned about the abdominal pain and diarrhea. She has not had any fever. No changes to diet. Her last Depo shot was 08/05/2022. She is happy with Depo as she has been on it since she was 15. She does not want any other form of contraception.   The following portions of the patient's history were reviewed and updated as appropriate: allergies, current medications, past family history, past medical history, past social history, past surgical history and problem list. Last pap smear on 04/01/2021 was NILM.  Health Maintenance Due  Topic Date Due   COVID-19 Vaccine (1) Never done   HPV VACCINES (2 - 3-dose series) 09/02/2022     Review of Systems:  Pertinent items are noted in HPI.   Objective:  Physical Exam Blood pressure 111/69, pulse 73, height 5\' 4"  (1.626 m), weight 141 lb (64 kg), not currently breastfeeding. VS reviewed, nursing note reviewed,  Constitutional: well developed, well nourished, no distress HEENT: normocephalic CV: normal rate Pulm/chest wall: normal effort Breast Exam: deferred Abdomen: soft, non-tender Neuro: alert and oriented x 3 Skin: warm, dry Psych: affect normal Pelvic exam: deferred  Assessment & Plan:  1. Encounter to establish care - I suspect abdominal pain is GI related as diarrhea occurs with the pain - She is requesting referral to primary care  - Ambulatory Referral to Primary Care  2. Irregular bleeding - Patient due for depo which I suspect is likely why she is spotting. Reviewed that spotting can occur if not taken Q12  weeks - Offered patient appointment to return today for depo shot because she did not bring prescription with her, but she declines as she wants to come back tomorrow   Return in about 1 day (around 11/03/2022) for for depo.   Brand Males, CNM 12:25 PM

## 2022-11-03 ENCOUNTER — Ambulatory Visit: Payer: Medicaid Other

## 2022-11-05 ENCOUNTER — Ambulatory Visit: Payer: Medicaid Other

## 2022-11-08 ENCOUNTER — Encounter: Payer: Self-pay | Admitting: Family Medicine

## 2022-11-08 ENCOUNTER — Ambulatory Visit (INDEPENDENT_AMBULATORY_CARE_PROVIDER_SITE_OTHER): Payer: Medicaid Other | Admitting: Family Medicine

## 2022-11-08 VITALS — BP 84/54 | HR 89 | Temp 98.3°F | Resp 20 | Ht 64.0 in | Wt 144.1 lb

## 2022-11-08 DIAGNOSIS — Z7689 Persons encountering health services in other specified circumstances: Secondary | ICD-10-CM

## 2022-11-08 DIAGNOSIS — R195 Other fecal abnormalities: Secondary | ICD-10-CM

## 2022-11-08 DIAGNOSIS — R1084 Generalized abdominal pain: Secondary | ICD-10-CM

## 2022-11-08 DIAGNOSIS — N912 Amenorrhea, unspecified: Secondary | ICD-10-CM | POA: Diagnosis not present

## 2022-11-08 LAB — POCT URINE PREGNANCY: Preg Test, Ur: NEGATIVE

## 2022-11-08 NOTE — Progress Notes (Signed)
New Patient Office Visit  Subjective    Patient ID: Stacy Richardson, female    DOB: February 08, 2001  Age: 22 y.o. MRN: [id number]  CC:  Chief Complaint  Patient presents with   Establish Care    Patient states that for the last month she has been having issues with her Bowels she states that she has been going more frequently and has also been experiencing loose stools with a lot of cramping  .  Patient also states that she has been spotting     HPI Jodie Akopyan presents to establish care. Pt is poor historian.  Pt is here due to vaginal spotting and abnormal bowel movements. She started Depo July  2023. She reports her most recent depo was February 2024. She says her bowel movements have been abnormal for the last month with abdominal pains/cramping. She says she has had loose stools for the last month.  She says she does eat a lot of spicy foods. She denies heartburn, denies melena or hematochezia. She reports she is due for her depo injection. Gets this at her OB/GYN office. LMP October 2022 and first depo injection was July 2023. Has been sexually active.  Pt has been seen last month and had std screening which was negative.    Outpatient Encounter Medications as of 11/08/2022  Medication Sig   medroxyPROGESTERone (DEPO-PROVERA) 150 MG/ML injection Inject 1 mL (150 mg total) into the muscle every 3 (three) months.   No facility-administered encounter medications on file as of 11/08/2022.    Past Medical History:  Diagnosis Date   Medical history non-contributory    Pelvic inflammatory disease     Past Surgical History:  Procedure Laterality Date   NO PAST SURGERIES      Family History  Problem Relation Age of Onset   Endometriosis Mother     Social History   Socioeconomic History   Marital status: Single    Spouse name: Not on file   Number of children: Not on file   Years of education: Not on file   Highest education level: Not on file  Occupational History   Not on  file  Tobacco Use   Smoking status: Former    Types: Cigarettes    Quit date: 06/25/2017    Years since quitting: 5.3    Passive exposure: Current   Smokeless tobacco: Never  Vaping Use   Vaping Use: Former   Substances: Nicotine  Substance and Sexual Activity   Alcohol use: Not Currently    Comment: every now and then   Drug use: Not Currently    Types: Marijuana   Sexual activity: Yes    Birth control/protection: Injection  Other Topics Concern   Not on file  Social History Narrative   Not on file   Social Determinants of Health   Financial Resource Strain: Low Risk  (10/29/2021)   Overall Financial Resource Strain (CARDIA)    Difficulty of Paying Living Expenses: Not very hard  Food Insecurity: Patient Declined (10/29/2021)   Hunger Vital Sign    Worried About Running Out of Food in the Last Year: Patient declined    Ran Out of Food in the Last Year: Patient declined  Transportation Needs: No Transportation Needs (10/29/2021)   PRAPARE - Administrator, Civil Service (Medical): No    Lack of Transportation (Non-Medical): No  Physical Activity: Sufficiently Active (10/29/2021)   Exercise Vital Sign    Days of Exercise per Week: 4 days  Minutes of Exercise per Session: 80 min  Stress: No Stress Concern Present (10/29/2021)   [employer] of Occupational Health - Occupational Stress Questionnaire    Feeling of Stress : Only a little  Social Connections: Moderately Isolated (10/29/2021)   Social Connection and Isolation Panel [NHANES]    Frequency of Communication with Friends and Family: More than three times a week    Frequency of Social Gatherings with Friends and Family: More than three times a week    Attends Religious Services: 1 to 4 times per year    Active Member of [employer] or Organizations: No    Attends Banker Meetings: Never    Marital Status: Never married  Intimate Partner Violence: Not At Risk (10/29/2021)   Humiliation, Afraid, Rape,  and Kick questionnaire    Fear of Current or Ex-Partner: No    Emotionally Abused: No    Physically Abused: No    Sexually Abused: No    Review of Systems  Gastrointestinal:  Positive for abdominal pain and diarrhea. Negative for blood in stool, constipation, heartburn, melena, nausea and vomiting.  Genitourinary:  Negative for dysuria, flank pain, frequency, hematuria and urgency.  All other systems reviewed and are negative.      Objective    BP (!) 84/54   Pulse 89   Temp 98.3 F (36.8 C) (Oral)   Resp 20   Ht 5\' 4"  (1.626 m)   Wt 144 lb 1.6 oz (65.4 kg)   SpO2 98%   BMI 24.73 kg/m   Physical Exam Vitals and nursing note reviewed.  Constitutional:      Appearance: Normal appearance. She is normal weight.  HENT:     Head: Normocephalic and atraumatic.     Right Ear: External ear normal.     Left Ear: External ear normal.     Nose: Nose normal.     Mouth/Throat:     Mouth: Mucous membranes are moist.     Pharynx: Oropharynx is clear.  Eyes:     Conjunctiva/sclera: Conjunctivae normal.     Pupils: Pupils are equal, round, and reactive to light.  Cardiovascular:     Rate and Rhythm: Normal rate and regular rhythm.     Pulses: Normal pulses.     Heart sounds: Normal heart sounds.  Pulmonary:     Effort: Pulmonary effort is normal.     Breath sounds: Normal breath sounds.  Abdominal:     General: Abdomen is flat. Bowel sounds are normal.  Skin:    General: Skin is warm.     Capillary Refill: Capillary refill takes less than 2 seconds.  Neurological:     General: No focal deficit present.     Mental Status: She is alert and oriented to person, place, and time. Mental status is at baseline.  Psychiatric:        Mood and Affect: Mood normal.        Thought Content: Thought content normal.        Judgment: Judgment normal.       Assessment & Plan:   Problem List Items Addressed This Visit   None Encounter to establish care with new doctor  Generalized  abdominal pain -     POCT urine pregnancy -     CBC with Differential/Platelet -     DG Abd 2 Views; Future -     US PELVIC COMPLETE WITH TRANSVAGINAL; Future  Abnormal stools -     CBC with Differential/Platelet  Amenorrhea -     US PELVIC COMPLETE WITH TRANSVAGINAL; Future   Urine pregnancy negative. To get CBC due to abnormal spotting and amenorrhea. Advised to start with abdominal xrays along with TVUS. To discuss depo with OB and whether or not she wants to continue this. To see OB office for depo injection.  Made note of low blood pressure today but pt is chronically low and denies dizziness or syncope.  No follow-ups on file.   Suzan Slick, MD

## 2022-11-09 LAB — CBC WITH DIFFERENTIAL/PLATELET
Basophils Absolute: 0 10*3/uL (ref 0.0–0.2)
Basos: 0 %
EOS (ABSOLUTE): 0.3 10*3/uL (ref 0.0–0.4)
Eos: 4 %
Hematocrit: 40.9 % (ref 34.0–46.6)
Hemoglobin: 13.4 g/dL (ref 11.1–15.9)
Immature Grans (Abs): 0 10*3/uL (ref 0.0–0.1)
Immature Granulocytes: 0 %
Lymphocytes Absolute: 3 10*3/uL (ref 0.7–3.1)
Lymphs: 41 %
MCH: 27.5 pg (ref 26.6–33.0)
MCHC: 32.8 g/dL (ref 31.5–35.7)
MCV: 84 fL (ref 79–97)
Monocytes Absolute: 0.7 10*3/uL (ref 0.1–0.9)
Monocytes: 9 %
Neutrophils Absolute: 3.3 10*3/uL (ref 1.4–7.0)
Neutrophils: 46 %
Platelets: 299 10*3/uL (ref 150–450)
RBC: 4.87 x10E6/uL (ref 3.77–5.28)
RDW: 12.3 % (ref 11.7–15.4)
WBC: 7.3 10*3/uL (ref 3.4–10.8)

## 2022-11-19 ENCOUNTER — Other Ambulatory Visit: Payer: Medicaid Other

## 2022-11-23 ENCOUNTER — Ambulatory Visit (INDEPENDENT_AMBULATORY_CARE_PROVIDER_SITE_OTHER): Payer: Medicaid Other

## 2022-11-23 DIAGNOSIS — R1084 Generalized abdominal pain: Secondary | ICD-10-CM

## 2022-11-23 DIAGNOSIS — N912 Amenorrhea, unspecified: Secondary | ICD-10-CM

## 2022-11-24 ENCOUNTER — Encounter: Payer: Self-pay | Admitting: Family Medicine

## 2022-12-06 ENCOUNTER — Other Ambulatory Visit: Payer: Self-pay | Admitting: Obstetrics and Gynecology

## 2022-12-09 ENCOUNTER — Ambulatory Visit (INDEPENDENT_AMBULATORY_CARE_PROVIDER_SITE_OTHER): Payer: Medicaid Other | Admitting: Obstetrics and Gynecology

## 2022-12-09 ENCOUNTER — Encounter: Payer: Self-pay | Admitting: Obstetrics and Gynecology

## 2022-12-09 VITALS — BP 98/67 | HR 65 | Ht 64.0 in | Wt 142.0 lb

## 2022-12-09 DIAGNOSIS — Z3042 Encounter for surveillance of injectable contraceptive: Secondary | ICD-10-CM

## 2022-12-09 DIAGNOSIS — N83201 Unspecified ovarian cyst, right side: Secondary | ICD-10-CM | POA: Diagnosis not present

## 2022-12-09 DIAGNOSIS — Z3202 Encounter for pregnancy test, result negative: Secondary | ICD-10-CM | POA: Diagnosis not present

## 2022-12-09 DIAGNOSIS — N857 Hematometra: Secondary | ICD-10-CM

## 2022-12-09 LAB — POCT URINE PREGNANCY: Preg Test, Ur: NEGATIVE

## 2022-12-09 MED ORDER — MEDROXYPROGESTERONE ACETATE 150 MG/ML IM SUSP
150.0000 mg | Freq: Once | INTRAMUSCULAR | Status: AC
  Administered 2022-12-09: 150 mg via INTRAMUSCULAR

## 2022-12-09 NOTE — Progress Notes (Signed)
   RETURN GYNECOLOGY VISIT  Subjective:  Stacy Richardson is a 22 y.o. [id number] on Depo presenting for abdominal pain/cramping and spotting  Seen by Camelia Eng CNM on 5/7 with 1 month of migratory abdominal pain/cramping, diarrhea and spotting. Has been on Depo on and off for 5+ years and reports that she has never had irregular spotting so she was concerned. Had been seen at urgent care a month prior and was negative for GC/CT and vaginitis panel negative as well. CMP WNL at that time. Given her GI symptoms, she was referred to PCP for further management/evaluation. PCP ordered and pelvic US that showed a 3cm simple cyst and 7mm hypoechoic region within the endometrium that was nonspecific, potentially complicated fluid. Abdominal XR was unremarkable.  Today, she reports that her symptoms have mostly resolved. Will still get occ cramping throughout her abdomen but it does not localize. No more diarrhea. Is having spotting but is overdue for her Depo. Is wondering if the cyst is the source of her symptoms.   I personally reviewed the following: - GC/CT 10/04/22: negative - BV/candida 10/04/22: negative - Office note from Dr. Wyline Mood 11/08/22 - CBC 11/08/22: Hgb 13.4 - UPT 11/08/22: negative - Pelvic US 11/26/22: EL 11.5 with 7mm hypoechoic region within endometrium. 3.3 x 2.4 x 3.2cm R ovarian cyst  Objective:   Vitals:   12/09/22 0845  BP: 98/67  Pulse: 65  Weight: 142 lb (64.4 kg)  Height: 5\' 4"  (1.626 m)   General:  Alert, oriented and cooperative. Patient is in no acute distress.  Skin: Skin is warm and dry. No rash noted.   Cardiovascular: Normal heart rate noted  Respiratory: Normal respiratory effort, no problems with respiration noted  Abdomen: Soft, non-tender, non-distended    Assessment and Plan:  Stacy Richardson is a 22 y.o. with possible hematometra & simple cyst of right ovary  Possible hematometra - Discussed that her symptoms may be GI related with spotting due to being  late on Depo. Also reviewed possibility of hematometra contributing to her symptoms. Discussed that typically with hematometra people often have severe unrelenting pain which is not c/w her current picture. Hematometra can usually be treated with cervical dilation in the office.  - Since symptoms are improving, will defer procedure today - Plan for repeat US & office appt in 8 weeks -     US PELVIC COMPLETE WITH TRANSVAGINAL; Future  Depo-Provera contraceptive status Would like to resume Depo. No unprotected sexual activity in the past 2 weeks. -     POCT urine pregnancy -     medroxyPROGESTERone (DEPO-PROVERA) injection 150 mg  Cyst of right ovary Symptoms are not being caused by her cyst. Reviewed that it is quite small and physiologic. No further surveillance is needed at this time.  Return in about 2 months (around 02/08/2023) for ultrasound & follow up appointment.  Lennart Pall, MD

## 2023-02-11 ENCOUNTER — Other Ambulatory Visit: Payer: Medicaid Other

## 2023-02-24 ENCOUNTER — Ambulatory Visit: Payer: Medicaid Other | Admitting: Obstetrics and Gynecology

## 2023-03-10 ENCOUNTER — Ambulatory Visit: Payer: Medicaid Other | Admitting: Obstetrics and Gynecology

## 2023-03-30 ENCOUNTER — Ambulatory Visit: Payer: Medicaid Other

## 2023-04-13 ENCOUNTER — Other Ambulatory Visit (HOSPITAL_COMMUNITY)
Admission: RE | Admit: 2023-04-13 | Discharge: 2023-04-13 | Disposition: A | Discharge status: Home / Self Care | Payer: Medicaid Other | Source: Ambulatory Visit | Attending: Obstetrics & Gynecology | Admitting: Obstetrics & Gynecology

## 2023-04-13 ENCOUNTER — Ambulatory Visit: Payer: Medicaid Other | Admitting: Obstetrics & Gynecology

## 2023-04-13 ENCOUNTER — Encounter: Payer: Self-pay | Admitting: Obstetrics & Gynecology

## 2023-04-13 VITALS — BP 118/69 | HR 102 | Ht 64.0 in | Wt 141.0 lb

## 2023-04-13 DIAGNOSIS — Z3202 Encounter for pregnancy test, result negative: Secondary | ICD-10-CM

## 2023-04-13 DIAGNOSIS — N898 Other specified noninflammatory disorders of vagina: Secondary | ICD-10-CM | POA: Diagnosis not present

## 2023-04-13 DIAGNOSIS — Z01419 Encounter for gynecological examination (general) (routine) without abnormal findings: Secondary | ICD-10-CM

## 2023-04-13 DIAGNOSIS — Z30013 Encounter for initial prescription of injectable contraceptive: Secondary | ICD-10-CM | POA: Diagnosis not present

## 2023-04-13 DIAGNOSIS — Z3200 Encounter for pregnancy test, result unknown: Secondary | ICD-10-CM

## 2023-04-13 LAB — POCT URINE PREGNANCY: Preg Test, Ur: NEGATIVE

## 2023-04-13 MED ORDER — MEDROXYPROGESTERONE ACETATE 150 MG/ML IM SUSP: 150.0000 mg | INTRAMUSCULAR | 0 refills | Status: DC

## 2023-04-13 MED ORDER — MEDROXYPROGESTERONE ACETATE 150 MG/ML IM SUSP
150.0000 mg | Freq: Once | INTRAMUSCULAR | Status: AC
Start: 2023-04-13 — End: 2023-04-13
  Administered 2023-04-13: 150 mg via INTRAMUSCULAR

## 2023-04-13 MED ORDER — MEDROXYPROGESTERONE ACETATE 150 MG/ML IM SUSP: 150.0000 mg | INTRAMUSCULAR | 3 refills | Status: DC

## 2023-04-13 NOTE — Progress Notes (Signed)
Subjective:     Stacy Richardson is a 22 y.o. female here for a routine exam.  Current complaints: vaginal odor and wants to restart depo.  [facility]gan[facility] and has not had unprotected sex.    Gynecologic History No LMP recorded. Patient has had an injection. Contraception: condoms and Depo-Provera injections Last Pap: 2022. Results were: normal Last mammogram: n/a.   Obstetric History OB History  Gravida Para Term Preterm AB Living  2 2 2     2   SAB IAB Ectopic Multiple Live Births        0 2    # Outcome Date GA Lbr Len/2nd Weight Sex Type Anes PTL Lv  2 Term 01/14/22 [redacted]w[redacted]d 06:42 / 00:20 6 lb 10.9 oz (3.03 kg) F Vag-Spont None  LIV  1 Term 01/27/18 [redacted]w[redacted]d 17:09 / 00:47 7 lb 8.1 oz (3.405 kg) M Vag-Vacuum None N LIV     Birth Comments: WNL     The following portions of the patient's history were reviewed and updated as appropriate: allergies, current medications, past family history, past medical history, past social history, past surgical history, and problem list.  Review of Systems Pertinent items noted in HPI and remainder of comprehensive ROS otherwise negative.    Objective:     Vitals:   04/13/23 1127  BP: 118/69  Pulse: (!) 102  Weight: 141 lb (64 kg)  Height: 5\' 4"  (1.626 m)   Vitals:  WNL General appearance: alert, cooperative and no distress  HEENT: Normocephalic, without obvious abnormality, atraumatic Eyes: negative Throat: lips, mucosa, and tongue normal; teeth and gums normal  Respiratory: Clear to auscultation bilaterally  CV: Regular rate and rhythm  Breasts:  Normal appearance, no masses or tenderness, no nipple retraction or dimpling  GI: Soft, non-tender; bowel sounds normal; no masses,  no organomegaly  GU: External Genitalia:  Tanner V, no lesion Urethra:  No prolapse   Vagina: Pink, normal rugae, no blood, + white discharge  Cervix: No CMT, no lesion  Uterus:  Normal size and contour, non tender  Adnexa: Normal, no masses, non  tender  Musculoskeletal: No edema, redness or tenderness in the calves or thighs  Skin: No lesions or rash  Lymphatic: Axillary adenopathy: none     Psychiatric: Normal mood and behavior        Assessment:    Healthy female exam.    Plan:   Pap up to date Depo provera today and q 3 months Vaginal odor--vaginitis and Gc/Chlam panel  wants diflucan if yeast and metrogel if BV

## 2023-04-14 LAB — CERVICOVAGINAL ANCILLARY ONLY
Bacterial Vaginitis (gardnerella): POSITIVE — AB
Candida Glabrata: NEGATIVE
Candida Vaginitis: NEGATIVE
Chlamydia: NEGATIVE
Comment: NEGATIVE
Comment: NEGATIVE
Comment: NEGATIVE
Comment: NEGATIVE
Comment: NEGATIVE
Comment: NORMAL
Neisseria Gonorrhea: NEGATIVE
Trichomonas: NEGATIVE

## 2023-04-15 ENCOUNTER — Encounter: Payer: Self-pay | Admitting: Obstetrics & Gynecology

## 2023-04-15 ENCOUNTER — Encounter: Payer: Self-pay | Admitting: Advanced Practice Midwife

## 2023-04-15 ENCOUNTER — Other Ambulatory Visit: Payer: Self-pay

## 2023-04-15 ENCOUNTER — Encounter: Payer: Self-pay | Admitting: Obstetrics and Gynecology

## 2023-04-15 ENCOUNTER — Encounter: Payer: Self-pay | Admitting: Women's Health

## 2023-04-15 DIAGNOSIS — B9689 Other specified bacterial agents as the cause of diseases classified elsewhere: Secondary | ICD-10-CM

## 2023-04-15 DIAGNOSIS — B3731 Acute candidiasis of vulva and vagina: Secondary | ICD-10-CM

## 2023-04-15 MED ORDER — METRONIDAZOLE 0.75 % VA GEL
1.0000 | Freq: Every day | VAGINAL | 1 refills | Status: DC
Start: 2023-04-15 — End: 2023-07-14

## 2023-04-15 MED ORDER — FLUCONAZOLE 150 MG PO TABS
150.0000 mg | ORAL_TABLET | Freq: Once | ORAL | 0 refills | Status: AC
Start: 2023-04-15 — End: 2023-04-15

## 2023-04-15 MED ORDER — METRONIDAZOLE 500 MG PO TABS
500.0000 mg | ORAL_TABLET | Freq: Two times a day (BID) | ORAL | 0 refills | Status: AC
Start: 2023-04-15 — End: 2023-04-22

## 2023-04-15 NOTE — Progress Notes (Signed)
Rx sent for bv

## 2023-04-15 NOTE — Progress Notes (Signed)
Reordered Rx to [facility] pharmacy per pt request.

## 2023-04-28 ENCOUNTER — Ambulatory Visit: Payer: Medicaid Other

## 2023-04-28 ENCOUNTER — Ambulatory Visit: Payer: Medicaid Other | Admitting: Family Medicine

## 2023-04-28 VITALS — BP 94/56 | HR 104 | Temp 98.3°F | Resp 18 | Ht 64.0 in | Wt 140.8 lb

## 2023-04-28 DIAGNOSIS — Z23 Encounter for immunization: Secondary | ICD-10-CM | POA: Diagnosis not present

## 2023-04-28 NOTE — Progress Notes (Signed)
   Subjective:    Patient ID: Stacy Richardson, female    DOB: 2000/09/29, 22 y.o.   MRN: [id number]  HPI Patient is here for a influenza vaccine   Review of Systems     Objective:   Physical Exam        Assessment & Plan:  Patient tolerated injection well

## 2023-07-14 ENCOUNTER — Encounter: Payer: Self-pay | Admitting: Obstetrics and Gynecology

## 2023-07-14 ENCOUNTER — Ambulatory Visit: Payer: Medicaid Other | Admitting: Obstetrics and Gynecology

## 2023-07-14 ENCOUNTER — Other Ambulatory Visit (HOSPITAL_COMMUNITY)
Admission: RE | Admit: 2023-07-14 | Discharge: 2023-07-14 | Disposition: A | Discharge status: Home / Self Care | Payer: Medicaid Other | Source: Ambulatory Visit | Attending: Obstetrics and Gynecology | Admitting: Obstetrics and Gynecology

## 2023-07-14 VITALS — BP 100/64 | HR 90 | Ht 64.0 in | Wt 142.0 lb

## 2023-07-14 DIAGNOSIS — Z113 Encounter for screening for infections with a predominantly sexual mode of transmission: Secondary | ICD-10-CM | POA: Diagnosis not present

## 2023-07-14 DIAGNOSIS — N898 Other specified noninflammatory disorders of vagina: Secondary | ICD-10-CM

## 2023-07-14 DIAGNOSIS — Z3042 Encounter for surveillance of injectable contraceptive: Secondary | ICD-10-CM | POA: Diagnosis not present

## 2023-07-14 MED ORDER — MEDROXYPROGESTERONE ACETATE 150 MG/ML IM SUSP
150.0000 mg | Freq: Once | INTRAMUSCULAR | Status: AC
Start: 2023-07-14 — End: 2023-07-14
  Administered 2023-07-14: 150 mg via INTRAMUSCULAR

## 2023-07-14 MED ORDER — BORIC ACID CRYS: 600.0000 mg | CRYSTALS | Freq: Every day | 5 refills | Status: AC

## 2023-07-14 NOTE — Progress Notes (Signed)
Pt did not supply Depo Provera- used office stock.

## 2023-07-14 NOTE — Progress Notes (Signed)
GYNECOLOGY OFFICE VISIT NOTE  History:   Stacy Richardson is a 23 y.o. [id number] here today for vaginal discharge and depo.   Discussed the use of AI scribe software for clinical note transcription with the patient, who gave verbal consent to proceed.  History of Present Illness   The patient, with a history of recurrent vaginal infections, presents with recent vaginal itching and white discharge. She suspects a yeast infection and has purchased over-the-counter cream, but has not yet started treatment. Prior to the onset of these symptoms, she had been experiencing an unspecified type of discharge. She has one sexual partner and does not believe she has an STD, but has been experiencing recurrent bouts of bacterial vaginosis (BV) and yeast infections. She has been on Depo-Provera for contraception since the birth of her child one year ago, and feels that her vaginal health has been imbalanced since then.        Past Medical History:  Diagnosis Date   Medical history non-contributory    Pelvic inflammatory disease     Past Surgical History:  Procedure Laterality Date   NO PAST SURGERIES      The following portions of the patient's history were reviewed and updated as appropriate: allergies, current medications, past family history, past medical history, past social history, past surgical history and problem list.   Health Maintenance:   Normal pap:   Diagnosis  Date Value Ref Range Status  04/01/2021   Final   - Negative for intraepithelial lesion or malignancy (NILM)   Review of Systems:  Pertinent items noted in HPI and remainder of comprehensive ROS otherwise negative.  Physical Exam:  BP 100/64   Pulse 90   Ht 5\' 4"  (1.626 m)   Wt 142 lb (64.4 kg)   BMI 24.37 kg/m  CONSTITUTIONAL: Well-developed, well-nourished female in no acute distress.  HEENT:  Normocephalic, atraumatic. External right and left ear normal. No scleral icterus.  NECK: Normal range of motion, supple,  no masses noted on observation SKIN: No rash noted. Not diaphoretic. No erythema. No pallor. MUSCULOSKELETAL: Normal range of motion. No edema noted. NEUROLOGIC: Alert and oriented to person, place, and time. Normal muscle tone coordination. No cranial nerve deficit noted. PSYCHIATRIC: Normal mood and affect. Normal behavior. Normal judgment and thought content.  PELVIC: Normal appearing external genitalia; normal urethral meatus; normal appearing vaginal mucosa.  No abnormal discharge noted.  Marland Kitchen Performed in the presence of a chaperone  Labs and Imaging No results found for this or any previous visit (from the past week). No results found.  Assessment and Plan:   1. Vaginal discharge (Primary) Patient reports recurrent episodes of vaginal discharge, itching, and suspected yeast infections and bacterial vaginosis. Recent episode of itching and white discharge suggestive of yeast infection. Patient has not yet used over-the-counter antifungal cream. Patient also reports discharge prior to this episode. Patient has one sexual partner and does not suspect an STD, but has consented to gonorrhea and chlamydia testing. -Perform swab for gonorrhea, chlamydia, trich, yeast, and BV. -If yeast is confirmed, patient to use over-the-counter antifungal cream. -If BV is confirmed, prescribe metronidazole. -Regardless of results, prescribe vaginal boric acid to help restore vaginal pH balance. Patient to use daily for 2 weeks, then twice weekly for 6 months.  - Cervicovaginal ancillary only( [place])  2. Screening examination for STI - Cervicovaginal ancillary only( [place])  3. Depo-Provera contraceptive status - medroxyPROGESTERone (DEPO-PROVERA) injection 150 mg   Meds ordered this encounter  Medications  medroxyPROGESTERone (DEPO-PROVERA) injection 150 mg   Boric Acid CRYS    Sig: Place 600 mg vaginally at bedtime. Use vaginally every night for two weeks then twice a week for 3 months     Dispense:  500 g    Refill:  5     Routine preventative health maintenance measures emphasized. Please refer to After Visit Summary for other counseling recommendations.   Return in about 12 weeks (around 10/06/2023) for depo.  Milas Hock, MD, FACOG Obstetrician & Gynecologist, [facility] for [facility], [facility]

## 2023-07-15 LAB — CERVICOVAGINAL ANCILLARY ONLY
Bacterial Vaginitis (gardnerella): NEGATIVE
Candida Glabrata: NEGATIVE
Candida Vaginitis: POSITIVE — AB
Chlamydia: NEGATIVE
Comment: NEGATIVE
Comment: NEGATIVE
Comment: NEGATIVE
Comment: NEGATIVE
Comment: NEGATIVE
Comment: NORMAL
Neisseria Gonorrhea: NEGATIVE
Trichomonas: NEGATIVE

## 2023-07-19 ENCOUNTER — Other Ambulatory Visit: Payer: Self-pay

## 2023-07-19 ENCOUNTER — Encounter: Payer: Self-pay | Admitting: Obstetrics and Gynecology

## 2023-07-19 MED ORDER — FLUCONAZOLE 150 MG PO TABS: 150.0000 mg | ORAL_TABLET | Freq: Once | ORAL | 0 refills | Status: AC

## 2023-08-30 ENCOUNTER — Ambulatory Visit: Payer: Self-pay | Admitting: Family Medicine

## 2023-08-30 NOTE — Telephone Encounter (Signed)
 call disconnected prior to triage. Attempted to contact patient on [phone] and [phone] no answer, unable to leave message to call back.  Chief Complaint: headache ongoing x 2 weeks. Tylenol not effective per agent note.  Symptoms: see above Frequency: 2 weeks  Pertinent Negatives: Patient denies na Disposition: [] ED /[] Urgent Care (no appt availability in office) / [] Appointment(In office/virtual)/ []  [facility]/ [] Home Care/ [] Refused Recommended Disposition /[] [facility]/ [x]  Follow-up with PCP Additional Notes:   Call disconnected and unsuccessful reaching patient on phone # given to NT #[phone]       Copied from CRM [id number]. Topic: Clinical - Red Word Triage >> Aug 30, 2023  1:25 PM Antony Haste wrote: Red Word that prompted transfer to Nurse Triage: Patient has been experiencing an ongoing headache for the past 2 weeks and it will not go away. She has taken Tylenol but the headache persist. Reason for Disposition  Unable to complete triage due to phone connection issues  Answer Assessment - Initial Assessment Questions N/A Patient called regarding headaches and before triage started call was disconnected. NT attempted to contact patient on # [phone] no answer unable to leave message. Tried [phone] and rang multiple time no answer unable to leave message.  Protocols used: No Contact or Duplicate Contact Call-A-AH

## 2023-08-30 NOTE — Telephone Encounter (Signed)
 Copied from CRM [id number]. Topic: Clinical - Red Word Triage >> Aug 30, 2023  1:32 PM Gery Pray wrote: Red Word that prompted transfer to Nurse Triage: Red Word that prompted transfer to Nurse Triage: Patient has been experiencing an ongoing headache for the past 2 weeks and it will not go away. She has taken Tylenol but the headache persist.   Chief Complaint: Headache  Symptoms: Headache  Frequency: Constant  Pertinent Negatives: Patient denies head injury, eye pain, stiff neck, fever Disposition: [] ED /[] Urgent Care (no appt availability in office) / [x] Appointment(In office/virtual)/ []  [facility]/ [] Home Care/ [] Refused Recommended Disposition /[] [facility]/ []  Follow-up with PCP Additional Notes: Patient reports she has had a headache for the last 2 week. She states her headache is constant and is improved with OTC medication. Patient is unsure of what the cause of her headache could be. She denies any head injury, stiff neck, fever, or eye pain. Appointment made for tomorrow for evaluation.     Reason for Disposition  [1] MODERATE headache (e.g., interferes with normal activities) AND [2] present > 24 hours AND [3] unexplained  (Exceptions: analgesics not tried, typical migraine, or headache part of viral illness)  Answer Assessment - Initial Assessment Questions 1. LOCATION: "Where does it hurt?"      Frontal  2. ONSET: "When did the headache start?" (Minutes, hours or days)      2 weeks ago  3. PATTERN: "Does the pain come and go, or has it been constant since it started?"     Constant  4. SEVERITY: "How bad is the pain?" and "What does it keep you from doing?"  (e.g., Scale 1-10; mild, moderate, or severe)   - MILD (1-3): doesn't interfere with normal activities    - MODERATE (4-7): interferes with normal activities or awakens from sleep    - SEVERE (8-10): excruciating pain, unable to do any normal activities        10/10 5. RECURRENT SYMPTOM: "Have you  ever had headaches before?" If Yes, ask: "When was the last time?" and "What happened that time?"      Yes, not as bad as this  6. CAUSE: "What do you think is causing the headache?"     Unsure  7. MIGRAINE: "Have you been diagnosed with migraine headaches?" If Yes, ask: "Is this headache similar?"      No 8. HEAD INJURY: "Has there been any recent injury to the head?"      No 9. OTHER SYMPTOMS: "Do you have any other symptoms?" (fever, stiff neck, eye pain, sore throat, cold symptoms)     No 10. PREGNANCY: "Is there any chance you are pregnant?" "When was your last menstrual period?"       No  Protocols used: Headache-A-AH

## 2023-08-31 ENCOUNTER — Encounter: Payer: Self-pay | Admitting: Family Medicine

## 2023-08-31 ENCOUNTER — Ambulatory Visit (INDEPENDENT_AMBULATORY_CARE_PROVIDER_SITE_OTHER): Admitting: Family Medicine

## 2023-08-31 VITALS — BP 95/57 | HR 67 | Temp 98.3°F | Resp 18 | Ht 64.0 in | Wt 147.6 lb

## 2023-08-31 DIAGNOSIS — R519 Headache, unspecified: Secondary | ICD-10-CM | POA: Diagnosis not present

## 2023-08-31 MED ORDER — IBUPROFEN 800 MG PO TABS
800.0000 mg | ORAL_TABLET | Freq: Three times a day (TID) | ORAL | 0 refills | Status: DC | PRN
Start: 2023-08-31 — End: 2024-04-03

## 2023-08-31 NOTE — Assessment & Plan Note (Addendum)
 Headache for past 2 weeks. Today is the first day without headache. Took ibuprofen 800 mg yesterday instead of Tylenol, which resolved the headache.  No head injury. No red flag symptoms. Normal neurological exam in office today. Ibuprofen 800 mg Q 8 hours with  food as needed. Will check pregnancy today per request.  Follow-up with this provider or PCP as needed.

## 2023-08-31 NOTE — Progress Notes (Signed)
 Acute Office Visit  Subjective:     Patient ID: Stacy Richardson, female    DOB: 05-Feb-2001, 23 y.o.   MRN: [id number]  Chief Complaint  Patient presents with   Headache    Patient states that she has been dealing with an excruciating headache for the past two weeks that seems to be happening everyday. She states that she would get headaches on and off but now its everyday    HPI Patient is in today for ongoing severe headaches occurring daily.   Systemic symptoms such as fever or chills: no History of cancer: no Neurological changes: no Sudden onset: occurs when she wakes up randomly. Worse headache ever: no Postural change in headache: no Pregnancy: unsure, wants pregnancy test Painful eye: no Visual changes: no  Post-trauma: none Unilateral: frontal/forehead Nausea or vomiting: no Photophobia: no Phonophobia: no Taking Tylenol that will calm it down, did not calm it down one time.  Switched to ibuprofen 800 mg  yesterday, resolved.  Works third shift. Does not get 8 hours of sleep.  No unusual activities or changes.  No headache currently today.    Review of Systems  Neurological:  Positive for headaches. Negative for weakness.        Objective:    BP (!) 95/57   Pulse 67   Temp 98.3 F (36.8 C) (Oral)   Resp 18   Ht 5\' 4"  (1.626 m)   Wt 147 lb 9.6 oz (67 kg)   SpO2 100%   BMI 25.34 kg/m  BP Readings from Last 3 Encounters:  08/31/23 (!) 95/57  07/14/23 100/64  04/28/23 (!) 94/56      Physical Exam Vitals and nursing note reviewed.  Constitutional:      General: She is not in acute distress.    Appearance: Normal appearance.  Cardiovascular:     Rate and Rhythm: Normal rate and regular rhythm.     Heart sounds: Normal heart sounds.  Pulmonary:     Effort: Pulmonary effort is normal.     Breath sounds: Normal breath sounds.  Skin:    General: Skin is warm and dry.  Neurological:     General: No focal deficit present.     Mental Status: She  is alert and oriented to person, place, and time. Mental status is at baseline.     GCS: GCS eye subscore is 4. GCS verbal subscore is 5. GCS motor subscore is 6.     Cranial Nerves: Cranial nerves 2-12 are intact.     Sensory: Sensation is intact.     Motor: Motor function is intact.     Coordination: Coordination is intact.     Gait: Gait is intact.     Comments: Alert, communicative with normal speech. PERRLA, vision grossly intact. EOM normal, no nystagmus. Smile symmetric. Uvula midline, swallowing intact, tongue midline and able to move side to side. Clinches jaw, puffs cheeks, closes eyes tightly, raises eyebrows. Hearing grossly intact. Moves head side to side against resistance. Shrugs shoulders against resistance.  5/5 upper and lower extremity strength. Ambulates with coordinated gait. Finger to nose normal. Rapid alternating hands normal. Heel to shin normal. Romberg negative.    Psychiatric:        Mood and Affect: Mood normal.        Behavior: Behavior normal.        Thought Content: Thought content normal.        Judgment: Judgment normal.     No results found for  any visits on 08/31/23.      Assessment & Plan:   Problem List Items Addressed This Visit     Nonintractable headache - Primary   Headache for past 2 weeks. Today is the first day without headache. Took ibuprofen 800 mg yesterday instead of Tylenol, which resolved the headache.  No head injury. No red flag symptoms. Normal neurological exam in office today. Ibuprofen 800 mg Q 8 hours with  food as needed. Will check pregnancy today per request.  Follow-up with this provider or PCP as needed.        Relevant Medications   ibuprofen (ADVIL) 800 MG tablet   Other Relevant Orders   hCG, serum, qualitative    Meds ordered this encounter  Medications   ibuprofen (ADVIL) 800 MG tablet    Sig: Take 1 tablet (800 mg total) by mouth every 8 (eight) hours as needed. Take with food.     Dispense:  30 tablet    Refill:  0    Supervising Provider:   Suzan Slick [[id number]]  Agrees with plan of care discussed.  Questions answered.   Return if symptoms worsen or fail to improve.  Novella Olive, FNP

## 2023-09-01 ENCOUNTER — Encounter: Payer: Self-pay | Admitting: Family Medicine

## 2023-09-01 LAB — HCG, SERUM, QUALITATIVE: hCG,Beta Subunit,Qual,Serum: NEGATIVE m[IU]/mL (ref <6)

## 2023-10-12 ENCOUNTER — Other Ambulatory Visit (HOSPITAL_COMMUNITY)
Admission: RE | Admit: 2023-10-12 | Discharge: 2023-10-12 | Disposition: A | Discharge status: Home / Self Care | Source: Ambulatory Visit | Attending: Obstetrics and Gynecology | Admitting: Obstetrics and Gynecology

## 2023-10-12 ENCOUNTER — Ambulatory Visit

## 2023-10-12 DIAGNOSIS — N898 Other specified noninflammatory disorders of vagina: Secondary | ICD-10-CM | POA: Insufficient documentation

## 2023-10-12 NOTE — Progress Notes (Signed)
 Swab collected

## 2023-10-13 LAB — CERVICOVAGINAL ANCILLARY ONLY
Bacterial Vaginitis (gardnerella): NEGATIVE
Candida Glabrata: NEGATIVE
Candida Vaginitis: NEGATIVE
Comment: NEGATIVE
Comment: NEGATIVE
Comment: NEGATIVE

## 2023-10-15 ENCOUNTER — Encounter: Payer: Self-pay | Admitting: Obstetrics and Gynecology

## 2023-11-16 ENCOUNTER — Ambulatory Visit: Admitting: Obstetrics and Gynecology

## 2023-11-16 ENCOUNTER — Encounter: Payer: Self-pay | Admitting: Obstetrics and Gynecology

## 2023-11-16 ENCOUNTER — Other Ambulatory Visit (HOSPITAL_COMMUNITY)
Admission: RE | Admit: 2023-11-16 | Discharge: 2023-11-16 | Disposition: A | Discharge status: Home / Self Care | Source: Ambulatory Visit | Attending: Obstetrics and Gynecology | Admitting: Obstetrics and Gynecology

## 2023-11-16 VITALS — BP 132/77 | HR 87 | Ht 64.0 in | Wt 145.0 lb

## 2023-11-16 DIAGNOSIS — R102 Pelvic and perineal pain unspecified side: Secondary | ICD-10-CM

## 2023-11-17 ENCOUNTER — Ambulatory Visit: Payer: Self-pay | Admitting: Obstetrics and Gynecology

## 2023-11-17 LAB — CERVICOVAGINAL ANCILLARY ONLY
Bacterial Vaginitis (gardnerella): NEGATIVE
Candida Glabrata: NEGATIVE
Candida Vaginitis: NEGATIVE
Chlamydia: NEGATIVE
Comment: NEGATIVE
Comment: NEGATIVE
Comment: NEGATIVE
Comment: NEGATIVE
Comment: NEGATIVE
Comment: NORMAL
Neisseria Gonorrhea: NEGATIVE
Trichomonas: NEGATIVE

## 2023-11-18 NOTE — Patient Instructions (Signed)
 Avoid: - Synthetic underwear - Tight pants - Swim suits, thongs, leotards, leggings for prolonged periods of time - Scented soap/shampoo - Bubble baths - Scented detergents, dryer sheets - Baby wipes - Feminine sprays, douches, powders - Panty liners - Dyed toilet paper - Shaving  Trying swapping out the above for: - Cotton or no underwear - Loose pants, skirts, dresses - Changing out of swimwear, thongs, and workout gear as soon as you're done exercising - Fragrance free soaps (like Dove sensitive skin) - Warm plain water baths - Unscented laundry detergent - Use a bedet or peri bottle to rinse instead of baby wipes - Tampons, cotton pads, cotton period underwear - Undyed toilet paper - Clipping hair

## 2023-11-18 NOTE — Progress Notes (Signed)
   RETURN GYNECOLOGY VISIT  Subjective:  Stacy Richardson is a 23 y.o. [id number] on Depo presenting with severe abdominal cramping and concerns about possible PID.  She has been experiencing severe abdominal cramping that began on Friday and persisted through the weekend, with the intensity decreasing by Tuesday. The pain was described as 'really bad' and not typical of her usual menstrual cramps. On Tuesday morning, the cramping was accompanied by a severe headache and stomach pain. Notes diarrhea x 2 weeks but it has since resolved. She recalls a previous episode a few years ago when she was treated for possible PID and she is worried about PID again. No fevers.   She has a history of frequent yeast infections despite maintaining proper hygiene. She has been using probiotics inconsistently and is unsure of their effectiveness. She also reports a history of bacterial vaginosis but understands the triggers for it. She experiences occasional abnormal discharge without a foul odor.  She works third shift, which may contribute to her gastrointestinal symptoms. She has been taking ibuprofen  for pain but has reduced the dosage due to concerns about her stomach issues.  Of note, I saw patient last year for pelvic pain that was potentially related to hematometra. A repeat US  was planned but she did not schedule.   Objective:   Vitals:   11/16/23 1030  BP: 132/77  Pulse: 87  Weight: 145 lb (65.8 kg)  Height: 5\' 4"  (1.626 m)   General:  Alert, oriented and cooperative. Patient is in no acute distress.  Skin: Skin is warm and dry. No rash noted.   Cardiovascular: Normal heart rate noted  Respiratory: Normal respiratory effort, no problems with respiration noted  Abdomen: Soft, non-tender, non-distended   Pelvic: NEFG. No levator/obturator tenderness. No cervical motion, uterine or adnexal tenderness. No abnormal discharge. Uterus normal size & mobile. No adnexal tenderness.   Exam performed in the  presence of a chaperone  Assessment and Plan:  Stacy Richardson is a 23 y.o. with acute on chronic pelvic pain  1. Pelvic pain (Primary) No evidence of PID, torsion or ectopic pregnancy on exam today. Suspect GI source of pain Will obtain swabs today to r/o STI, pelvic US  to assess for hematometra or structural abnormality contributing to pain  Reviewed hospital precautions for severe pain with fever or persistent vomiting - Cervicovaginal ancillary only - US  PELVIC COMPLETE WITH TRANSVAGINAL; Future  2. Frequent vulvovaginal candidiasis Discussed potential for suppressive treatment if we have swabs c/w VVC 2x/58mo or 3x/year  Follow up for annual in 6 months or sooner prn  Izell Marsh, MD

## 2023-12-20 ENCOUNTER — Ambulatory Visit (INDEPENDENT_AMBULATORY_CARE_PROVIDER_SITE_OTHER)

## 2023-12-20 DIAGNOSIS — N939 Abnormal uterine and vaginal bleeding, unspecified: Secondary | ICD-10-CM

## 2023-12-20 DIAGNOSIS — R102 Pelvic and perineal pain: Secondary | ICD-10-CM

## 2024-02-29 ENCOUNTER — Other Ambulatory Visit: Payer: Self-pay | Admitting: Obstetrics and Gynecology

## 2024-03-07 ENCOUNTER — Telehealth: Payer: Self-pay | Admitting: *Deleted

## 2024-03-07 NOTE — Telephone Encounter (Signed)
 Returned call from 03/06/2024 at 2:09 PM. Patient called the office but message cut off after a few seconds. Voicemail is not set up.

## 2024-03-08 ENCOUNTER — Encounter: Payer: Self-pay | Admitting: Family Medicine

## 2024-03-08 ENCOUNTER — Ambulatory Visit (INDEPENDENT_AMBULATORY_CARE_PROVIDER_SITE_OTHER): Admitting: Family Medicine

## 2024-03-08 VITALS — BP 105/69 | HR 80 | Temp 98.2°F | Ht 64.0 in | Wt 136.0 lb

## 2024-03-08 DIAGNOSIS — Z113 Encounter for screening for infections with a predominantly sexual mode of transmission: Secondary | ICD-10-CM

## 2024-03-08 DIAGNOSIS — N898 Other specified noninflammatory disorders of vagina: Secondary | ICD-10-CM

## 2024-03-08 MED ORDER — FLUCONAZOLE 150 MG PO TABS: 150.0000 mg | ORAL_TABLET | Freq: Once | ORAL | 0 refills | Status: AC

## 2024-03-08 NOTE — Progress Notes (Signed)
   Acute Office Visit  Subjective:     Patient ID: Stacy Richardson, female    DOB: January 21, 2001, 23 y.o.   MRN: [id number]  Chief Complaint  Patient presents with   Vaginitis    Three days of over-the-counter Miconazole and then her period started. She still has some itching. Wants to see if the yeast infection is cleared up. Wants to be tested for STD. Has one partner. Could not get a appt. Across the street so she called here     HPI Patient is in today for possible  yeast infection.  Treated self with OTC medications. Continues to have vaginal itching. Wants STD check, declines blood draw today. No known exposures to STD. One partner.  No urinary symptoms.    ROS      Objective:    BP 105/69   Pulse 80   Temp 98.2 F (36.8 C) (Oral)   Ht 5' 4 (1.626 m)   Wt 136 lb (61.7 kg)   LMP 03/01/2024 (Exact Date)   SpO2 98%   BMI 23.34 kg/m    Physical Exam Vitals and nursing note reviewed.  Constitutional:      General: She is not in acute distress.    Appearance: Normal appearance.  Cardiovascular:     Rate and Rhythm: Normal rate and regular rhythm.     Heart sounds: Normal heart sounds.  Pulmonary:     Effort: Pulmonary effort is normal.     Breath sounds: Normal breath sounds.  Skin:    General: Skin is warm and dry.  Neurological:     General: No focal deficit present.     Mental Status: She is alert. Mental status is at baseline.  Psychiatric:        Mood and Affect: Mood normal.        Behavior: Behavior normal.        Thought Content: Thought content normal.        Judgment: Judgment normal.     No results found for any visits on 03/08/24.      Assessment & Plan:   Problem List Items Addressed This Visit     Vaginal itching - Primary   Persistent vaginal itching after OTC treatment. No urinary symptoms.  Diflucan  150 mg once. Nu Swab today. Follow-up based on results.        Relevant Medications   fluconazole  (DIFLUCAN ) 150 MG tablet    Other Relevant Orders   NuSwab Vaginitis Plus (VG+)   Screening examination for sexually transmitted disease   No known exposures. One partner. Nu Swab today. Declines blood for STDs today. Follow-up based on results.       Relevant Orders   NuSwab Vaginitis Plus (VG+)    Meds ordered this encounter  Medications   fluconazole  (DIFLUCAN ) 150 MG tablet    Sig: Take 1 tablet (150 mg total) by mouth once for 1 dose.    Dispense:  1 tablet    Refill:  0    Supervising Provider:   METHENEY, Stacy Richardson [[id number]]  Agrees with plan of care discussed.  Questions answered.   Return if symptoms worsen or fail to improve.  Stacy JONELLE Brownie, FNP

## 2024-03-08 NOTE — Assessment & Plan Note (Signed)
 Persistent vaginal itching after OTC treatment. No urinary symptoms.  Diflucan  150 mg once. Nu Swab today. Follow-up based on results.

## 2024-03-08 NOTE — Assessment & Plan Note (Signed)
 No known exposures. One partner. Nu Swab today. Declines blood for STDs today. Follow-up based on results.

## 2024-03-11 ENCOUNTER — Ambulatory Visit: Payer: Self-pay | Admitting: Family Medicine

## 2024-03-11 DIAGNOSIS — B9689 Other specified bacterial agents as the cause of diseases classified elsewhere: Secondary | ICD-10-CM | POA: Insufficient documentation

## 2024-03-11 LAB — NUSWAB VAGINITIS PLUS (VG+)
Atopobium vaginae: HIGH {score} — AB
Candida albicans, NAA: NEGATIVE
Candida glabrata, NAA: NEGATIVE
Chlamydia trachomatis, NAA: NEGATIVE
Neisseria gonorrhoeae, NAA: NEGATIVE
Trich vag by NAA: NEGATIVE

## 2024-03-11 MED ORDER — METRONIDAZOLE 500 MG PO TABS: 500.0000 mg | ORAL_TABLET | Freq: Two times a day (BID) | ORAL | 0 refills | Status: DC

## 2024-03-12 ENCOUNTER — Other Ambulatory Visit: Payer: Self-pay | Admitting: Family Medicine

## 2024-03-12 DIAGNOSIS — B9689 Other specified bacterial agents as the cause of diseases classified elsewhere: Secondary | ICD-10-CM

## 2024-03-12 MED ORDER — METRONIDAZOLE 0.75 % VA GEL: 1.0000 | Freq: Two times a day (BID) | VAGINAL | 0 refills | Status: DC

## 2024-03-26 ENCOUNTER — Other Ambulatory Visit: Payer: Self-pay | Admitting: Family Medicine

## 2024-03-26 DIAGNOSIS — B9689 Other specified bacterial agents as the cause of diseases classified elsewhere: Secondary | ICD-10-CM

## 2024-03-26 MED ORDER — METRONIDAZOLE 0.75 % VA GEL: 1.0000 | Freq: Two times a day (BID) | VAGINAL | 0 refills | Status: AC

## 2024-04-03 NOTE — Telephone Encounter (Signed)
 The topical metronidazole  gel does not caused yeast infections.  So she really should not have an issue if he she uses that.

## 2024-05-01 ENCOUNTER — Ambulatory Visit
Admission: EM | Admit: 2024-05-01 | Discharge: 2024-05-01 | Disposition: A | Discharge status: Home / Self Care | Payer: Self-pay | Attending: Nurse Practitioner | Admitting: Nurse Practitioner

## 2024-05-01 DIAGNOSIS — N939 Abnormal uterine and vaginal bleeding, unspecified: Secondary | ICD-10-CM | POA: Insufficient documentation

## 2024-05-01 LAB — POCT URINE PREGNANCY: Preg Test, Ur: NEGATIVE

## 2024-05-01 NOTE — Discharge Instructions (Signed)
 The pregnancy test was negative.  Cytology swab is pending.  You will be contacted if the pending test results are abnormal.  You will also have access to the results via MyChart. You may take over-the-counter ibuprofen  to help with abdominal cramping and vaginal bleeding. Increase fluids and allow for plenty of rest. If your pending test results are abnormal, please notify all sexual partners. If treatment is required, you will need to refrain from sexual intercourse for an additional 7 days after completing treatment. If your vaginal bleeding continues, recommend follow-up with your gynecologist for further evaluation. Go to the emergency department if you experience heavy vaginal bleeding that is causing you to saturate more than 1 pad per hour, lightheadedness, dizziness, or other concerns. Follow-up as needed.

## 2024-05-01 NOTE — ED Provider Notes (Signed)
 [facility]    CSN: [id number] Arrival date & time: 05/01/24  0941      History   Chief Complaint Chief Complaint  Patient presents with   Abdominal Pain    HPI Stacy Richardson is a 23 y.o. female.   The history is provided by the patient.   Patient presents for complaints of vaginal bleeding.  Patient states that her period started on 04/24/2024.  States last evening, she was still having bright red vaginal bleeding.  States this morning, the blood has now transition to dark-colored blood.  She states that she has had intermittent pelvic cramping.  States that her period has been regular since March after she stopped Depo.  Patient reports that she does have a gynecologist, states that she did have an ultrasound back in May, and there was nothing abnormal seen on the ultrasound.  Patient states that she is concerned that she may have endometriosis.  Patient reports that she normally uses condoms during sexual intercourse, states since her last STI test, she has had unprotected sex.  Patient denies fever, chills, lightheadedness, dizziness, nausea, vomiting, vaginal odor, vaginal itching, or vaginal discharge.  Past Medical History:  Diagnosis Date   Medical history non-contributory    Pelvic inflammatory disease     Patient Active Problem List   Diagnosis Date Noted   BV (bacterial vaginosis) 03/11/2024   Nonintractable headache 08/31/2023   Screening examination for sexually transmitted disease 04/01/2021   Vaginal itching 06/11/2020   History of PID 09/21/2019    Past Surgical History:  Procedure Laterality Date   NO PAST SURGERIES      OB History     Gravida  2   Para  2   Term  2   Preterm      AB      Living  2      SAB      IAB      Ectopic      Multiple  0   Live Births  2            Home Medications    Prior to Admission medications   Medication Sig Start Date End Date Taking? Authorizing Provider  Boric Acid CRYS Place  600 mg vaginally at bedtime. Use vaginally every night for two weeks then twice a week for 3 months Patient not taking: Reported on 11/16/2023 07/14/23   Cleatus Moccasin, MD  metroNIDAZOLE  (METROGEL ) 0.75 % vaginal gel Place 1 Applicatorful vaginally 2 (two) times daily. 03/26/24   Booker Darice SAUNDERS, FNP    Family History Family History  Problem Relation Age of Onset   Endometriosis Mother     Social History Social History   Tobacco Use   Smoking status: Former    Current packs/day: 0.00    Types: Cigarettes    Quit date: 06/25/2017    Years since quitting: 6.8    Passive exposure: Current   Smokeless tobacco: Never  Vaping Use   Vaping status: Former   Substances: Nicotine  Substance Use Topics   Alcohol use: Not Currently    Comment: every now and then   Drug use: Yes    Types: Marijuana     Allergies   Patient has no known allergies.   Review of Systems Review of Systems Per HPI  Physical Exam Triage Vital Signs ED Triage Vitals  Encounter Vitals Group     BP 05/01/24 0950 109/66     Girls Systolic BP Percentile --  Girls Diastolic BP Percentile --      Boys Systolic BP Percentile --      Boys Diastolic BP Percentile --      Pulse Rate 05/01/24 0950 94     Resp 05/01/24 0950 16     Temp 05/01/24 0950 98.3 F (36.8 C)     Temp Source 05/01/24 0950 Oral     SpO2 05/01/24 0950 95 %     Weight --      Height --      Head Circumference --      Peak Flow --      Pain Score 05/01/24 0953 9     Pain Loc --      Pain Education --      Exclude from Growth Chart --    No data found.  Updated Vital Signs BP 109/66 (BP Location: Right Arm)   Pulse 94   Temp 98.3 F (36.8 C) (Oral)   Resp 16   LMP 04/24/2024 (Exact Date) Comment: start date  SpO2 95%   Visual Acuity Right Eye Distance:   Left Eye Distance:   Bilateral Distance:    Right Eye Near:   Left Eye Near:    Bilateral Near:     Physical Exam Vitals and nursing note reviewed.   Constitutional:      General: She is not in acute distress.    Appearance: She is well-developed.  HENT:     Head: Normocephalic.  Eyes:     Extraocular Movements: Extraocular movements intact.     Pupils: Pupils are equal, round, and reactive to light.  Cardiovascular:     Rate and Rhythm: Normal rate and regular rhythm.     Pulses: Normal pulses.     Heart sounds: Normal heart sounds.  Pulmonary:     Effort: Pulmonary effort is normal.     Breath sounds: Normal breath sounds.  Abdominal:     General: Bowel sounds are normal.     Palpations: Abdomen is soft.     Tenderness: There is no abdominal tenderness.  Genitourinary:    Comments: GU exam deferred, self swab performed  Musculoskeletal:     Cervical back: Normal range of motion.  Skin:    General: Skin is warm and dry.  Neurological:     General: No focal deficit present.     Mental Status: She is alert and oriented to person, place, and time.  Psychiatric:        Mood and Affect: Mood normal.        Behavior: Behavior normal.      UC Treatments / Results  Labs (all labs ordered are listed, but only abnormal results are displayed) Labs Reviewed  POCT URINE PREGNANCY  CERVICOVAGINAL ANCILLARY ONLY    EKG   Radiology No results found.  Procedures Procedures (including critical care time)  Medications Ordered in UC Medications - No data to display  Initial Impression / Assessment and Plan / UC Course  I have reviewed the triage vital signs and the nursing notes.  Pertinent labs & imaging results that were available during my care of the patient were reviewed by me and considered in my medical decision making (see chart for details).  Urine pregnancy test is negative.  Cytology swab is pending as patient had 1 episode of unprotected sex.  She has had 1 day of vaginal bleeding that she describes.  She states that the bleeding has transitioned to dark-colored blood, which is not unusual  given the course of  a menses cycle.  She is otherwise asymptomatic.  Patient reports concern for endometriosis, advised patient that she will need to follow-up with her gynecologist for further evaluation.  Supportive care recommendations were provided and discussed with the patient to include fluids, rest, and over-the-counter ibuprofen  as needed.  Patient was advised to follow-up with her gynecologist for reevaluation.  Patient was in agreement with this plan of care and verbalizes understanding.  All questions were answered.  Patient stable for discharge.   Final Clinical Impressions(s) / UC Diagnoses   Final diagnoses:  Vaginal bleeding     Discharge Instructions      The pregnancy test was negative.  Cytology swab is pending.  You will be contacted if the pending test results are abnormal.  You will also have access to the results via MyChart. You may take over-the-counter ibuprofen  to help with abdominal cramping and vaginal bleeding. Increase fluids and allow for plenty of rest. If your pending test results are abnormal, please notify all sexual partners. If treatment is required, you will need to refrain from sexual intercourse for an additional 7 days after completing treatment. If your vaginal bleeding continues, recommend follow-up with your gynecologist for further evaluation. Go to the emergency department if you experience heavy vaginal bleeding that is causing you to saturate more than 1 pad per hour, lightheadedness, dizziness, or other concerns. Follow-up as needed.     ED Prescriptions   None    PDMP not reviewed this encounter.   Gilmer Etta PARAS, NP 05/01/24 1016

## 2024-05-01 NOTE — ED Triage Notes (Signed)
 Pt reports she has RLQ abdominal pain, brown, and cramping x 9 days.    States MP was due to end x 1 day ago

## 2024-05-03 LAB — CERVICOVAGINAL ANCILLARY ONLY
Chlamydia: NEGATIVE
Comment: NEGATIVE
Comment: NEGATIVE
Comment: NORMAL
Neisseria Gonorrhea: NEGATIVE
Trichomonas: NEGATIVE

## 2024-07-04 ENCOUNTER — Ambulatory Visit (INDEPENDENT_AMBULATORY_CARE_PROVIDER_SITE_OTHER): Admitting: *Deleted

## 2024-07-04 ENCOUNTER — Other Ambulatory Visit (HOSPITAL_COMMUNITY)
Admission: RE | Admit: 2024-07-04 | Discharge: 2024-07-04 | Disposition: A | Discharge status: Home / Self Care | Source: Ambulatory Visit | Attending: Obstetrics & Gynecology | Admitting: Obstetrics & Gynecology

## 2024-07-04 DIAGNOSIS — Z113 Encounter for screening for infections with a predominantly sexual mode of transmission: Secondary | ICD-10-CM | POA: Insufficient documentation

## 2024-07-04 NOTE — Progress Notes (Signed)
" ° °  NURSE VISIT- VAGINITIS/STD/POC  SUBJECTIVE:  Stacy Richardson is a 24 y.o. [id number] GYN patientfemale here for a vaginal swab for STD screen.  She reports the following symptoms: none. Denies abnormal vaginal bleeding, significant pelvic pain, fever, or UTI symptoms.  OBJECTIVE:  There were no vitals taken for this visit.  Appears well, in no apparent distress  ASSESSMENT: Vaginal swab for STD screen  PLAN: Self-collected vaginal probe for Gonorrhea, Chlamydia, Trichomonas sent to lab Treatment: to be determined once results are received Follow-up as needed if symptoms persist/worsen, or new symptoms develop  Alan LITTIE Fischer  07/04/2024 10:07 AM  "

## 2024-07-05 ENCOUNTER — Ambulatory Visit: Payer: Self-pay | Admitting: Adult Health

## 2024-07-05 ENCOUNTER — Telehealth: Payer: Self-pay | Admitting: Adult Health

## 2024-07-05 DIAGNOSIS — A749 Chlamydial infection, unspecified: Secondary | ICD-10-CM | POA: Insufficient documentation

## 2024-07-05 LAB — CERVICOVAGINAL ANCILLARY ONLY
Chlamydia: POSITIVE — AB
Comment: NEGATIVE
Comment: NEGATIVE
Comment: NORMAL
Neisseria Gonorrhea: NEGATIVE
Trichomonas: NEGATIVE

## 2024-07-05 MED ORDER — DOXYCYCLINE HYCLATE 100 MG PO TABS: 100.0000 mg | ORAL_TABLET | Freq: Two times a day (BID) | ORAL | 0 refills | Status: AC

## 2024-07-05 NOTE — Telephone Encounter (Signed)
 Pt has questions about her labs. Pt is requesting a call from the office.

## 2024-07-05 NOTE — Telephone Encounter (Signed)
 Spoke with patient, she saw her results on mychart. Advised that Delon will review and send her in medication. No other questions at this time.

## 2024-07-09 ENCOUNTER — Other Ambulatory Visit: Payer: Self-pay | Admitting: Adult Health

## 2024-07-09 MED ORDER — FLUCONAZOLE 150 MG PO TABS: ORAL_TABLET | ORAL | 1 refills | Status: AC

## 2024-07-09 NOTE — Progress Notes (Signed)
 Rx diflucan .

## 2024-07-25 ENCOUNTER — Ambulatory Visit: Admitting: Advanced Practice Midwife

## 2024-07-26 ENCOUNTER — Encounter: Payer: Self-pay | Admitting: Obstetrics & Gynecology

## 2024-07-26 ENCOUNTER — Ambulatory Visit: Admitting: Obstetrics & Gynecology

## 2024-07-26 ENCOUNTER — Other Ambulatory Visit (HOSPITAL_COMMUNITY)
Admission: RE | Admit: 2024-07-26 | Discharge: 2024-07-26 | Disposition: A | Source: Ambulatory Visit | Attending: Obstetrics & Gynecology | Admitting: Obstetrics & Gynecology

## 2024-07-26 VITALS — BP 93/62 | HR 80 | Ht 64.0 in | Wt 121.0 lb

## 2024-07-26 DIAGNOSIS — R102 Pelvic and perineal pain unspecified side: Secondary | ICD-10-CM

## 2024-07-26 DIAGNOSIS — N760 Acute vaginitis: Secondary | ICD-10-CM | POA: Diagnosis present

## 2024-07-26 DIAGNOSIS — Z124 Encounter for screening for malignant neoplasm of cervix: Secondary | ICD-10-CM

## 2024-07-26 DIAGNOSIS — Z8619 Personal history of other infectious and parasitic diseases: Secondary | ICD-10-CM | POA: Diagnosis not present

## 2024-07-26 DIAGNOSIS — Z113 Encounter for screening for infections with a predominantly sexual mode of transmission: Secondary | ICD-10-CM

## 2024-07-26 DIAGNOSIS — N898 Other specified noninflammatory disorders of vagina: Secondary | ICD-10-CM | POA: Diagnosis not present

## 2024-07-26 DIAGNOSIS — Z3009 Encounter for other general counseling and advice on contraception: Secondary | ICD-10-CM

## 2024-07-26 NOTE — Progress Notes (Signed)
" ° °  GYN VISIT Patient name: Stacy Richardson MRN [id number]  Date of birth: 10-Sep-2000 Chief Complaint:   Vaginitis  History of Present Illness:   Stacy Richardson is a 24 y.o. [phone] female being seen today for the following concerns:  Treated for Chlamydia in Jan- completed medication and notes that her stomach is still bothering her. Notes immediately had her period during treatment, which made her feel worse.  Once she completed her period, took treatment for yeast and notes that things still don't feel right.   Notes a white discharge, little itching.  No odor.  Notes dull pelvic pain- occasional sharp- having a tough time describing it other than things seem off.  Does not require medication.  Previously had diarrhea, but not currently.  No fever or chills.  No urinary concerns.  Not sexually active since diagnosis.  Not interested in a pregnancy.  Previously on Depot, but didn't like the weight gain and other side effects.  Also wishes to reconsider her options.   No LMP recorded.    Review of Systems:   Pertinent items are noted in HPI Denies fever/chills, dizziness, headaches, visual disturbances, fatigue, shortness of breath, chest pain Pertinent History Reviewed:   Past Surgical History:  Procedure Laterality Date   NO PAST SURGERIES      Past Medical History:  Diagnosis Date   Medical history non-contributory    Pelvic inflammatory disease    Reviewed problem list, medications and allergies. Physical Assessment:   Vitals:   07/26/24 0854  BP: 93/62  Pulse: 80  Weight: 121 lb (54.9 kg)  Height: 5' 4 (1.626 m)  Body mass index is 20.77 kg/m.       Physical Examination:   General appearance: alert, well appearing, and in no distress  Psych: mood appropriate, normal affect  Skin: warm & dry   Cardiovascular: normal heart rate noted  Respiratory: normal respiratory effort, no distress  Abdomen: soft, non-tender- no reproducible pain.  No rebound or guarding.   No masses noted  Pelvic: VULVA: normal appearing vulva with no masses, tenderness or lesions, VAGINA: normal appearing vagina, minimal white discharge noted, no odor, no lesions, CERVIX: normal appearing cervix without discharge or lesions, UTERUS: uterus is normal size, shape, consistency and nontender, ADNEXA: normal adnexa in size, nontender and no masses.  No cervical motion tenderness  Extremities: no edema   Chaperone: Aleck Blase    Assessment & Plan:  1) Pelvic pain, h/o Chlamydia, Vaginal discharge -vaginitis panel obtained, further management pending results - Discussed potential long-term sequelae of chlamydia or PID.  Last ultrasound completed July 2025.  Should results be negative and patient still noting pelvic pain, would consider pelvic ultrasound  2) Contraceptive management -reviewed all contraceptive options including pills, patch, ring, Depot or LARCs -risk/benefit and potential side effects of each were reviewed -questions and concerns were addressed, pt not sure and plans to review her options - Also encouraged condom use both for contraception and to prevent STI risk  3) Preventive screening -pap collected  Orders Placed This Encounter  Procedures   RPR W/RFLX TO RPR TITER, TREPONEMAL AB, SCREEN AND DIAGNOSIS   HIV antibody (with reflex)    Return in about 1 year (around 07/26/2025) for Annual.   Corky Blumstein, DO Attending Obstetrician & Gynecologist, [facility], [facility], [facility]    "

## 2024-07-27 LAB — CYTOLOGY - PAP
Adequacy: ABSENT
Chlamydia: NEGATIVE
Comment: NEGATIVE
Comment: NEGATIVE
Comment: NORMAL
Diagnosis: NEGATIVE
Neisseria Gonorrhea: NEGATIVE
Trichomonas: NEGATIVE

## 2024-07-27 LAB — CERVICOVAGINAL ANCILLARY ONLY
Bacterial Vaginitis (gardnerella): NEGATIVE
Candida Glabrata: NEGATIVE
Candida Vaginitis: NEGATIVE
Comment: NEGATIVE
Comment: NEGATIVE
Comment: NEGATIVE

## 2024-07-28 ENCOUNTER — Ambulatory Visit: Payer: Self-pay | Admitting: Obstetrics & Gynecology

## 2024-08-09 ENCOUNTER — Ambulatory Visit: Admitting: Obstetrics & Gynecology
